# Patient Record
Sex: Female | Born: 1945 | Race: Black or African American | Hispanic: No | Smoking: Former smoker
Health system: Southern US, Community
[De-identification: ages and names within clinical notes are randomized; demographics above are authoritative.]

## PROBLEM LIST (undated history)

## (undated) DIAGNOSIS — I10 Essential (primary) hypertension: Secondary | ICD-10-CM

## (undated) DIAGNOSIS — I5030 Unspecified diastolic (congestive) heart failure: Secondary | ICD-10-CM

## (undated) DIAGNOSIS — M1990 Unspecified osteoarthritis, unspecified site: Secondary | ICD-10-CM

## (undated) DIAGNOSIS — M069 Rheumatoid arthritis, unspecified: Secondary | ICD-10-CM

## (undated) DIAGNOSIS — D649 Anemia, unspecified: Secondary | ICD-10-CM

## (undated) DIAGNOSIS — R918 Other nonspecific abnormal finding of lung field: Secondary | ICD-10-CM

## (undated) DIAGNOSIS — I472 Ventricular tachycardia: Principal | ICD-10-CM

## (undated) DIAGNOSIS — E876 Hypokalemia: Secondary | ICD-10-CM

## (undated) HISTORY — DX: Anemia, unspecified: D64.9

## (undated) HISTORY — DX: Hypokalemia: E87.6

## (undated) HISTORY — DX: Ventricular tachycardia: I47.2

## (undated) HISTORY — DX: Unspecified osteoarthritis, unspecified site: M19.90

## (undated) HISTORY — PX: ABDOMINAL HYSTERECTOMY: SHX81

## (undated) HISTORY — DX: Rheumatoid arthritis, unspecified: M06.9

## (undated) HISTORY — PX: OTHER SURGICAL HISTORY: SHX169

## (undated) HISTORY — DX: Other nonspecific abnormal finding of lung field: R91.8

## (undated) HISTORY — DX: Unspecified diastolic (congestive) heart failure: I50.30

---

## 2000-09-01 ENCOUNTER — Other Ambulatory Visit: Admission: RE | Admit: 2000-09-01 | Discharge: 2000-09-01 | Payer: Self-pay | Admitting: Obstetrics and Gynecology

## 2001-06-20 ENCOUNTER — Encounter: Payer: Self-pay | Admitting: Obstetrics and Gynecology

## 2001-06-20 ENCOUNTER — Ambulatory Visit (HOSPITAL_COMMUNITY): Admission: RE | Admit: 2001-06-20 | Discharge: 2001-06-20 | Payer: Self-pay | Admitting: Obstetrics and Gynecology

## 2002-06-21 ENCOUNTER — Ambulatory Visit (HOSPITAL_COMMUNITY): Admission: RE | Admit: 2002-06-21 | Discharge: 2002-06-21 | Payer: Self-pay | Admitting: Obstetrics and Gynecology

## 2002-06-21 ENCOUNTER — Encounter: Payer: Self-pay | Admitting: Obstetrics and Gynecology

## 2003-09-10 ENCOUNTER — Ambulatory Visit (HOSPITAL_COMMUNITY): Admission: RE | Admit: 2003-09-10 | Discharge: 2003-09-10 | Payer: Self-pay | Admitting: Obstetrics and Gynecology

## 2004-06-15 ENCOUNTER — Ambulatory Visit (HOSPITAL_COMMUNITY): Admission: RE | Admit: 2004-06-15 | Discharge: 2004-06-15 | Payer: Self-pay | Admitting: *Deleted

## 2004-10-01 ENCOUNTER — Ambulatory Visit (HOSPITAL_COMMUNITY): Admission: RE | Admit: 2004-10-01 | Discharge: 2004-10-01 | Payer: Self-pay | Admitting: Obstetrics and Gynecology

## 2004-10-11 ENCOUNTER — Encounter: Admission: RE | Admit: 2004-10-11 | Discharge: 2004-10-11 | Payer: Self-pay | Admitting: Obstetrics and Gynecology

## 2004-12-14 ENCOUNTER — Encounter: Admission: RE | Admit: 2004-12-14 | Discharge: 2004-12-14 | Payer: Self-pay | Admitting: Obstetrics and Gynecology

## 2005-06-16 ENCOUNTER — Encounter: Admission: RE | Admit: 2005-06-16 | Discharge: 2005-06-16 | Payer: Self-pay | Admitting: Obstetrics and Gynecology

## 2006-01-04 ENCOUNTER — Ambulatory Visit (HOSPITAL_COMMUNITY): Admission: RE | Admit: 2006-01-04 | Discharge: 2006-01-04 | Payer: Self-pay | Admitting: Obstetrics and Gynecology

## 2006-03-30 ENCOUNTER — Ambulatory Visit (HOSPITAL_COMMUNITY): Admission: RE | Admit: 2006-03-30 | Discharge: 2006-03-30 | Payer: Self-pay | Admitting: Family Medicine

## 2007-01-10 ENCOUNTER — Ambulatory Visit (HOSPITAL_COMMUNITY): Admission: RE | Admit: 2007-01-10 | Discharge: 2007-01-10 | Payer: Self-pay | Admitting: Obstetrics and Gynecology

## 2008-02-27 ENCOUNTER — Ambulatory Visit (HOSPITAL_COMMUNITY): Admission: RE | Admit: 2008-02-27 | Discharge: 2008-02-27 | Payer: Self-pay | Admitting: Obstetrics and Gynecology

## 2009-02-04 ENCOUNTER — Ambulatory Visit (HOSPITAL_COMMUNITY): Admission: RE | Admit: 2009-02-04 | Discharge: 2009-02-04 | Payer: Self-pay | Admitting: Family Medicine

## 2009-03-11 ENCOUNTER — Ambulatory Visit (HOSPITAL_COMMUNITY): Admission: RE | Admit: 2009-03-11 | Discharge: 2009-03-11 | Payer: Self-pay | Admitting: Obstetrics and Gynecology

## 2009-08-03 ENCOUNTER — Ambulatory Visit (HOSPITAL_COMMUNITY): Admission: RE | Admit: 2009-08-03 | Discharge: 2009-08-03 | Payer: Self-pay | Admitting: Family Medicine

## 2010-05-02 ENCOUNTER — Encounter: Payer: Self-pay | Admitting: Obstetrics and Gynecology

## 2010-06-29 LAB — CREATININE, SERUM
GFR calc Af Amer: >60 mL/min (ref >60)
GFR calc non Af Amer: >60 mL/min (ref >60)

## 2010-07-14 ENCOUNTER — Other Ambulatory Visit: Payer: Self-pay | Admitting: Obstetrics and Gynecology

## 2010-07-14 DIAGNOSIS — Z139 Encounter for screening, unspecified: Secondary | ICD-10-CM

## 2010-07-15 LAB — CREATININE, SERUM
Creatinine, Ser: 0.82 mg/dL (ref 0.4–1.2)
GFR calc Af Amer: >60 mL/min (ref >60)

## 2010-07-19 ENCOUNTER — Ambulatory Visit (HOSPITAL_COMMUNITY)
Admission: RE | Admit: 2010-07-19 | Discharge: 2010-07-19 | Disposition: A | Discharge status: Home / Self Care | Payer: Medicare Other | Source: Ambulatory Visit | Attending: Obstetrics and Gynecology | Admitting: Obstetrics and Gynecology

## 2010-07-19 DIAGNOSIS — Z139 Encounter for screening, unspecified: Secondary | ICD-10-CM

## 2010-07-19 DIAGNOSIS — Z1231 Encounter for screening mammogram for malignant neoplasm of breast: Secondary | ICD-10-CM | POA: Insufficient documentation

## 2010-09-28 ENCOUNTER — Other Ambulatory Visit (HOSPITAL_COMMUNITY): Payer: Self-pay | Admitting: Internal Medicine

## 2010-09-28 ENCOUNTER — Ambulatory Visit (HOSPITAL_COMMUNITY)
Admission: RE | Admit: 2010-09-28 | Discharge: 2010-09-28 | Disposition: A | Discharge status: Home / Self Care | Payer: Medicare Other | Source: Ambulatory Visit | Attending: Internal Medicine | Admitting: Internal Medicine

## 2010-09-28 DIAGNOSIS — M79606 Pain in leg, unspecified: Secondary | ICD-10-CM

## 2010-09-28 DIAGNOSIS — M79609 Pain in unspecified limb: Secondary | ICD-10-CM | POA: Insufficient documentation

## 2010-10-19 ENCOUNTER — Other Ambulatory Visit (HOSPITAL_COMMUNITY): Payer: Self-pay | Admitting: Orthopaedic Surgery

## 2010-10-19 DIAGNOSIS — M25562 Pain in left knee: Secondary | ICD-10-CM

## 2010-10-21 ENCOUNTER — Ambulatory Visit (HOSPITAL_COMMUNITY)
Admission: RE | Admit: 2010-10-21 | Discharge: 2010-10-21 | Disposition: A | Discharge status: Home / Self Care | Payer: Medicare Other | Source: Ambulatory Visit | Attending: Orthopaedic Surgery | Admitting: Orthopaedic Surgery

## 2010-10-21 DIAGNOSIS — M25562 Pain in left knee: Secondary | ICD-10-CM

## 2010-10-21 DIAGNOSIS — M7120 Synovial cyst of popliteal space [Baker], unspecified knee: Secondary | ICD-10-CM | POA: Insufficient documentation

## 2010-10-21 DIAGNOSIS — M25569 Pain in unspecified knee: Secondary | ICD-10-CM | POA: Insufficient documentation

## 2010-10-21 DIAGNOSIS — M659 Unspecified synovitis and tenosynovitis, unspecified site: Secondary | ICD-10-CM | POA: Insufficient documentation

## 2011-01-31 ENCOUNTER — Ambulatory Visit (HOSPITAL_COMMUNITY)
Admission: RE | Admit: 2011-01-31 | Discharge: 2011-01-31 | Disposition: A | Discharge status: Home / Self Care | Payer: Medicare Other | Source: Ambulatory Visit | Attending: Family Medicine | Admitting: Family Medicine

## 2011-01-31 ENCOUNTER — Other Ambulatory Visit (HOSPITAL_COMMUNITY): Payer: Self-pay | Admitting: Family Medicine

## 2011-01-31 DIAGNOSIS — R059 Cough, unspecified: Secondary | ICD-10-CM | POA: Insufficient documentation

## 2011-01-31 DIAGNOSIS — R05 Cough: Secondary | ICD-10-CM | POA: Insufficient documentation

## 2011-04-22 ENCOUNTER — Other Ambulatory Visit (HOSPITAL_COMMUNITY): Payer: Self-pay | Admitting: Family Medicine

## 2011-04-22 DIAGNOSIS — R0789 Other chest pain: Secondary | ICD-10-CM

## 2011-04-25 ENCOUNTER — Encounter (HOSPITAL_COMMUNITY): Payer: Self-pay

## 2011-04-25 ENCOUNTER — Ambulatory Visit (HOSPITAL_COMMUNITY)
Admission: RE | Admit: 2011-04-25 | Discharge: 2011-04-25 | Disposition: A | Discharge status: Home / Self Care | Payer: Medicare Other | Source: Ambulatory Visit | Attending: Family Medicine | Admitting: Family Medicine

## 2011-04-25 DIAGNOSIS — J984 Other disorders of lung: Secondary | ICD-10-CM | POA: Insufficient documentation

## 2011-04-25 DIAGNOSIS — R0789 Other chest pain: Secondary | ICD-10-CM

## 2011-04-25 DIAGNOSIS — J479 Bronchiectasis, uncomplicated: Secondary | ICD-10-CM | POA: Insufficient documentation

## 2011-04-25 DIAGNOSIS — R042 Hemoptysis: Secondary | ICD-10-CM | POA: Insufficient documentation

## 2011-04-25 HISTORY — DX: Essential (primary) hypertension: I10

## 2011-04-25 MED ORDER — IOHEXOL 300 MG/ML  SOLN
80.0000 mL | Freq: Once | INTRAMUSCULAR | Status: AC | PRN
  Administered 2011-04-25: 80 mL via INTRAVENOUS

## 2011-04-26 ENCOUNTER — Ambulatory Visit (HOSPITAL_COMMUNITY)
Admission: RE | Admit: 2011-04-26 | Discharge: 2011-04-26 | Disposition: A | Discharge status: Home / Self Care | Payer: Medicare Other | Source: Ambulatory Visit | Attending: Family Medicine | Admitting: Family Medicine

## 2011-04-26 DIAGNOSIS — L89899 Pressure ulcer of other site, unspecified stage: Secondary | ICD-10-CM | POA: Insufficient documentation

## 2011-04-26 DIAGNOSIS — L8992 Pressure ulcer of unspecified site, stage 2: Secondary | ICD-10-CM | POA: Insufficient documentation

## 2011-04-26 DIAGNOSIS — IMO0001 Reserved for inherently not codable concepts without codable children: Secondary | ICD-10-CM | POA: Insufficient documentation

## 2011-04-26 DIAGNOSIS — L89309 Pressure ulcer of unspecified buttock, unspecified stage: Secondary | ICD-10-CM | POA: Insufficient documentation

## 2011-04-26 NOTE — Progress Notes (Signed)
Physical Therapy - Wound Therapy  Evaluation  Patient Details  Name: Diana Baker MRN: [id number] Date of Birth: May 06, 1945  Today's Date: 04/26/2011 Time: 4098-1191 Time Calculation (min): 37 min  Visit#: 1  of 3   Re-eval: 05/10/11  Wound Therapy Pressure Ulcer 04/26/11 Stage II -  Partial thickness loss of dermis presenting as a shallow open ulcer with a red, pink wound bed without slough. (Active)  State of Healing Early/partial granulation 04/26/2011  2:37 PM  Site / Wound Assessment Dry;Granulation tissue 04/26/2011  2:37 PM  % Wound base Red or Granulating 75% 04/26/2011  2:37 PM  % Wound base Yellow 25% 04/26/2011  2:37 PM  % Wound base Black 0% 04/26/2011  2:37 PM  % Wound base Other (Comment) 0% 04/26/2011  2:37 PM  Peri-wound Assessment Intact 04/26/2011  2:37 PM  Wound Length (cm) 3 cm 04/26/2011  2:37 PM  Wound Width (cm) 5 cm 04/26/2011  2:37 PM  Wound Depth (cm) 0.1 cm 04/26/2011  2:37 PM  Margins Attached edges (approximated) 04/26/2011  2:37 PM  Drainage Amount Scant 04/26/2011  2:37 PM  Drainage Description Serous 04/26/2011  2:37 PM  Treatment Cleansed;Irrigation 04/26/2011  2:37 PM  Dressing Type Hydrogel;Tape dressing 04/26/2011  2:37 PM  Dressing Changed;Intact 04/26/2011  2:37 PM     Pressure Ulcer 04/26/11 Stage II -  Partial thickness loss of dermis presenting as a shallow open ulcer with a red, pink wound bed without slough. (Active)  State of Healing Early/partial granulation 04/26/2011  2:37 PM  Site / Wound Assessment Dry;Granulation tissue 04/26/2011  2:37 PM  % Wound base Red or Granulating 75% 04/26/2011  2:37 PM  % Wound base Yellow 25% 04/26/2011  2:37 PM  % Wound base Black 0% 04/26/2011  2:37 PM  % Wound base Other (Comment) 0% 04/26/2011  2:37 PM  Wound Length (cm) 3 cm 04/26/2011  2:37 PM  Wound Width (cm) 3 cm 04/26/2011  2:37 PM  Wound Depth (cm) 0.2 cm 04/26/2011  2:37 PM  Margins Attached edges (approximated) 04/26/2011  2:37 PM  Drainage Amount Scant  04/26/2011  2:37 PM  Drainage Description Serous 04/26/2011  2:37 PM  Treatment Cleansed;Irrigation 04/26/2011  2:37 PM  Dressing Type Hydrogel;Tape dressing 04/26/2011  2:37 PM     Pressure Ulcer 04/26/11 Stage II -  Partial thickness loss of dermis presenting as a shallow open ulcer with a red, pink wound bed without slough. (Active)  State of Healing Early/partial granulation 04/26/2011  2:37 PM  Site / Wound Assessment Dry 04/26/2011  2:37 PM  % Wound base Red or Granulating 75% 04/26/2011  2:37 PM  % Wound base Yellow 25% 04/26/2011  2:37 PM  % Wound base Black 0% 04/26/2011  2:37 PM  Peri-wound Assessment Intact 04/26/2011  2:37 PM  Wound Length (cm) 1.5 cm 04/26/2011  2:37 PM  Wound Width (cm) 2.75 cm 04/26/2011  2:37 PM  Wound Depth (cm) 0.2 cm 04/26/2011  2:37 PM  Margins Attached edges (approximated) 04/26/2011  2:37 PM  Drainage Amount Scant 04/26/2011  2:37 PM  Drainage Description Serous 04/26/2011  2:37 PM  Treatment Cleansed;Irrigation 04/26/2011  2:37 PM  Dressing Type Hydrogel;Tape dressing 04/26/2011  2:37 PM       Physical Therapy Assessment and Plan Wound Therapy - Assess/Plan/Recommendations Wound Therapy - Clinical Statement: Pt with multiple pressure sores.  Pt has been caring for ulcers at home but they are located in a difficult place for her to ensure proper care is  given.  Pt will benefit from skilled PT one time a week to ensue wounds are healing properly. Factors Delaying/Impairing Wound Healing: Immobility;Multiple medical problems Hydrotherapy Plan: Dressing change;Patient/family education;Debridement (debridement if needed.) Wound Therapy - Frequency:  (1x wk) Wound Therapy - Current Recommendations: PT PT Assessment and Plan Clinical Impression Statement: Pt with non-healing wounds who will benefit from skilled PT to ensure proper healing. Rehab Potential: Good PT Frequency: Min 1X/week PT Duration:  (3 wks) PT Treatment/Interventions: Other (comment) (wound  care.) PT Plan: Use Tegaderm to dress wounds next treatment.    Goals  wounds to decrease size by 50%; increase depth to no greater than .1 cm and be 100% granulation.  Problem List There is no problem list on file for this patient.   RUSSELL,CINDY 04/26/2011, 2:56 PM

## 2011-05-03 ENCOUNTER — Ambulatory Visit (HOSPITAL_COMMUNITY)
Admission: RE | Admit: 2011-05-03 | Discharge: 2011-05-03 | Disposition: A | Discharge status: Home / Self Care | Payer: Medicare Other | Source: Ambulatory Visit | Attending: Family Medicine | Admitting: Family Medicine

## 2011-05-03 NOTE — Progress Notes (Signed)
Physical Therapy - Wound Therapy  Treatment  Patient Details  Name: Diana Baker MRN: [id number] Date of Birth: 01/05/1946  Today's Date: 05/03/2011 Time: 7829-5621 Time Calculation (min): 25 min  Visit#: 2  of 3   Re-eval: 05/10/11 Charges: Selective debridement (= or < 20 cm)  Wound Therapy Pressure Ulcer 04/26/11 Stage II -  Partial thickness loss of dermis presenting as a shallow open ulcer with a red, pink wound bed without slough. (Active)  State of Healing Early/partial granulation 04/26/2011  2:37 PM  Site / Wound Assessment Dry;Granulation tissue 04/26/2011  2:37 PM  % Wound base Red or Granulating 100% 05/03/2011  1:00 PM  % Wound base Yellow 25% 04/26/2011  2:37 PM  % Wound base Black 0% 04/26/2011  2:37 PM  % Wound base Other (Comment) 0% 04/26/2011  2:37 PM  Peri-wound Assessment Intact 05/03/2011  1:00 PM  Wound Length (cm) 3 cm 04/26/2011  2:37 PM  Wound Width (cm) 5 cm 04/26/2011  2:37 PM  Wound Depth (cm) 0.1 cm 04/26/2011  2:37 PM  Margins Attached edges (approximated) 05/03/2011  1:00 PM  Drainage Amount Scant 05/03/2011  1:00 PM  Drainage Description Serous 05/03/2011  1:00 PM  Treatment Cleansed;Irrigation 05/03/2011  1:00 PM  Dressing Type Hydrogel;Tape dressing 05/03/2011  1:00 PM  Dressing Changed;Intact 05/03/2011  1:00 PM     Pressure Ulcer 04/26/11 Stage II -  Partial thickness loss of dermis presenting as a shallow open ulcer with a red, pink wound bed without slough. (Active)  State of Healing Early/partial granulation 04/26/2011  2:37 PM  Site / Wound Assessment Dry;Granulation tissue 04/26/2011  2:37 PM  % Wound base Red or Granulating 100% 05/03/2011  1:00 PM  % Wound base Yellow 25% 04/26/2011  2:37 PM  % Wound base Black 0% 04/26/2011  2:37 PM  % Wound base Other (Comment) 0% 04/26/2011  2:37 PM  Wound Length (cm) 3 cm 04/26/2011  2:37 PM  Wound Width (cm) 3 cm 04/26/2011  2:37 PM  Wound Depth (cm) 0.2 cm 04/26/2011  2:37 PM  Margins Attached edges (approximated)  05/03/2011  1:00 PM  Drainage Amount Scant 05/03/2011  1:00 PM  Drainage Description Serous 05/03/2011  1:00 PM  Treatment Cleansed;Irrigation 05/03/2011  1:00 PM  Dressing Type Hydrogel;Tape dressing 05/03/2011  1:00 PM     Pressure Ulcer 04/26/11 Stage II -  Partial thickness loss of dermis presenting as a shallow open ulcer with a red, pink wound bed without slough. (Active)  State of Healing Early/partial granulation 04/26/2011  2:37 PM  Site / Wound Assessment Dry 04/26/2011  2:37 PM  % Wound base Red or Granulating 100% 05/03/2011  1:00 PM  % Wound base Yellow 25% 04/26/2011  2:37 PM  % Wound base Black 0% 04/26/2011  2:37 PM  Peri-wound Assessment Intact 05/03/2011  1:00 PM  Wound Length (cm) 1.5 cm 04/26/2011  2:37 PM  Wound Width (cm) 2.75 cm 04/26/2011  2:37 PM  Wound Depth (cm) 0.2 cm 04/26/2011  2:37 PM  Margins Attached edges (approximated) 05/03/2011  1:00 PM  Drainage Amount Scant 05/03/2011  1:00 PM  Drainage Description Serous 05/03/2011  1:00 PM  Treatment Cleansed;Irrigation 05/03/2011  1:00 PM  Dressing Type Hydrogel;Tape dressing 05/03/2011  1:00 PM     Wound 05/03/11 Other (Comment) Thigh Upper;Medial Small pink/red wound located on inner thight distal to vaginal area (Active)  % Wound base Red or Granulating 100% 05/03/2011  1:00 PM  Peri-wound Assessment Intact 05/03/2011  1:00  PM  Wound Length (cm) 0.5 cm 05/03/2011  1:00 PM  Wound Width (cm) 2 cm 05/03/2011  1:00 PM  Margins Attached edges (approximated) 05/03/2011  1:00 PM  Drainage Amount Scant 05/03/2011  1:00 PM  Drainage Description Serous 05/03/2011  1:00 PM  Treatment Cleansed;Irrigation 05/03/2011  1:00 PM  Dressing Type Hydrogel;Tape dressing 05/03/2011  1:00 PM  Dressing Changed New 05/03/2011  1:00 PM  Dressing Status Clean;Dry;Intact 05/03/2011  1:00 PM    Physical Therapy Assessment and Plan Wound Therapy - Assess/Plan/Recommendations Hydrotherapy Plan: Dressing change;Patient/family education;Debridement PT Assessment  and Plan Clinical Impression Statement: Pt tolerates tx well. Wounds present with 100% granulation. Fourth wound noted on inner thigh distal to vaginal area. New LDA made (see doc flowsheets). Pt reports that the MD has given her Mypirocin to put on her wounds.  PT Plan: Continue per PT POC. Use Tegaderm to dress wounds next treatment.     Problem List There is no problem list on file for this patient.   Seth Bake [facility] 05/03/2011, 1:48 PM

## 2011-05-09 ENCOUNTER — Other Ambulatory Visit (HOSPITAL_COMMUNITY): Payer: Self-pay

## 2011-05-10 ENCOUNTER — Ambulatory Visit (HOSPITAL_COMMUNITY)
Admission: RE | Admit: 2011-05-10 | Discharge: 2011-05-10 | Disposition: A | Discharge status: Home / Self Care | Payer: Medicare Other | Source: Ambulatory Visit | Attending: Pulmonary Disease | Admitting: Pulmonary Disease

## 2011-05-10 ENCOUNTER — Ambulatory Visit (HOSPITAL_COMMUNITY)
Admission: RE | Admit: 2011-05-10 | Discharge: 2011-05-10 | Disposition: A | Discharge status: Home / Self Care | Payer: Medicare Other | Source: Ambulatory Visit | Attending: Family Medicine | Admitting: Family Medicine

## 2011-05-10 ENCOUNTER — Telehealth (HOSPITAL_COMMUNITY): Payer: Self-pay

## 2011-05-10 DIAGNOSIS — R0602 Shortness of breath: Secondary | ICD-10-CM | POA: Insufficient documentation

## 2011-05-10 LAB — BLOOD GAS, ARTERIAL
Acid-Base Excess: 2 mmol/L (ref 0.0–2.0)
O2 Saturation: 93 %
TCO2: 23.7 mmol/L (ref 0–100)
pO2, Arterial: 65.6 mmHg — ABNORMAL LOW (ref 80.0–100.0)

## 2011-05-10 MED ORDER — ALBUTEROL SULFATE (5 MG/ML) 0.5% IN NEBU
2.5000 mg | INHALATION_SOLUTION | Freq: Once | RESPIRATORY_TRACT | Status: AC
  Administered 2011-05-10: 2.5 mg via RESPIRATORY_TRACT

## 2011-05-10 NOTE — Progress Notes (Signed)
Physical Therapy - Wound Therapy  Treatment  Patient Details  Name: Diana Baker MRN: [id number] Date of Birth: 09-Dec-1945  Today's Date: 05/10/2011 Time: 4098-1191 Time Calculation (min): 30 min  Visit#: 3  of 3   Re-eval: 05/10/11  Wound Therapy Pressure Ulcer 04/26/11 Stage II -  Partial thickness loss of dermis presenting as a shallow open ulcer with a red, pink wound bed without slough. (Active)  State of Healing Early/partial granulation 05/10/2011 11:38 AM  Site / Wound Assessment Dry;Granulation tissue 05/10/2011 11:38 AM  % Wound base Red or Granulating 100% 05/10/2011 11:38 AM  % Wound base Yellow 0% 05/10/2011 11:38 AM  % Wound base Black 0% 05/10/2011 11:38 AM  % Wound base Other (Comment) 0% 05/10/2011 11:38 AM  Peri-wound Assessment Intact 05/10/2011 11:38 AM  Wound Length (cm) 3 cm 04/26/2011  2:37 PM  Wound Width (cm) 5 cm 04/26/2011  2:37 PM  Wound Depth (cm) 0.1 cm 04/26/2011  2:37 PM  Margins Attached edges (approximated) 05/03/2011  1:00 PM  Drainage Amount Scant 05/03/2011  1:00 PM  Drainage Description Serous 05/03/2011  1:00 PM  Treatment Cleansed;Irrigation 05/10/2011 11:38 AM  Dressing Type Hydrogel;Tape dressing 05/10/2011 11:38 AM  Dressing Changed;Intact 05/10/2011 11:38 AM     Pressure Ulcer 04/26/11 Stage II -  Partial thickness loss of dermis presenting as a shallow open ulcer with a red, pink wound bed without slough. (Active)  State of Healing Early/partial granulation 05/10/2011 11:38 AM  Site / Wound Assessment Clean;Dry;Granulation tissue 05/10/2011 11:38 AM  % Wound base Red or Granulating 100% 05/10/2011 11:38 AM  % Wound base Yellow 0% 05/10/2011 11:38 AM  % Wound base Black 0% 05/10/2011 11:38 AM  % Wound base Other (Comment) 0% 05/10/2011 11:38 AM  Wound Length (cm) 3 cm 04/26/2011  2:37 PM  Wound Width (cm) 3 cm 04/26/2011  2:37 PM  Wound Depth (cm) 0.2 cm 04/26/2011  2:37 PM  Margins Attached edges (approximated) 05/10/2011 11:38 AM  Drainage Amount Scant  05/10/2011 11:38 AM  Drainage Description Serous 05/10/2011 11:38 AM  Treatment Irrigation;Cleansed 05/10/2011 11:38 AM  Dressing Type Hydrogel;Tape dressing 05/10/2011 11:38 AM  Dressing Clean;Dry;Intact 05/10/2011 11:38 AM     Pressure Ulcer 04/26/11 Stage II -  Partial thickness loss of dermis presenting as a shallow open ulcer with a red, pink wound bed without slough. (Active)  State of Healing Early/partial granulation 05/10/2011 11:38 AM  Site / Wound Assessment Dry 05/10/2011 11:38 AM  % Wound base Red or Granulating 100% 05/10/2011 11:38 AM  % Wound base Yellow 0% 05/10/2011 11:38 AM  % Wound base Black 0% 05/10/2011 11:38 AM  % Wound base Other (Comment) 0% 05/10/2011 11:38 AM  Peri-wound Assessment Intact 05/10/2011 11:38 AM  Wound Length (cm) 1.5 cm 04/26/2011  2:37 PM  Wound Width (cm) 2.75 cm 04/26/2011  2:37 PM  Wound Depth (cm) 0.2 cm 04/26/2011  2:37 PM  Margins Attached edges (approximated) 05/10/2011 11:38 AM  Drainage Amount Scant 05/10/2011 11:38 AM  Drainage Description Serous 05/10/2011 11:38 AM  Treatment Cleansed;Irrigation 05/10/2011 11:38 AM  Dressing Type Hydrogel;Tape dressing 05/10/2011 11:38 AM  Dressing Changed;Clean;Dry;Intact 05/10/2011 11:38 AM     Wound 05/03/11 Other (Comment) Thigh Upper;Medial Small pink/red wound located on inner thight distal to vaginal area (Active)  % Wound base Red or Granulating 100% 05/03/2011  1:00 PM  Peri-wound Assessment Intact 05/03/2011  1:00 PM  Wound Length (cm) 0.5 cm 05/03/2011  1:00 PM  Wound Width (cm) 2 cm 05/03/2011  1:00 PM  Margins Attached edges (approximated) 05/03/2011  1:00 PM  Drainage Amount Scant 05/03/2011  1:00 PM  Drainage Description Serous 05/03/2011  1:00 PM  Treatment Cleansed;Irrigation 05/03/2011  1:00 PM  Dressing Type Hydrogel;Tape dressing 05/03/2011  1:00 PM  Dressing Changed New 05/03/2011  1:00 PM  Dressing Status Clean;Dry;Intact 05/03/2011  1:00 PM       Physical Therapy Assessment and Plan Wound Therapy -  Assess/Plan/Recommendations Wound Therapy - Clinical Statement: Pt with multiple pressure sore with scant drainage and no slough, 100% granulation on all pressure sores with no debridement required just cleaned and irrigated.  Noted red spot on sacrumm, no debridement/dressings complete to new spot today, continue looking next visit to check progress. Hydrotherapy Plan: Dressing change;Other (comment) (Re-assess next session)      Goals    Problem List There is no problem list on file for this patient.   Juel Burrow 05/10/2011, 12:00 PM

## 2011-05-11 NOTE — Procedures (Signed)
[name], NORGARD                   ACCOUNT NO.:  [id number]  MEDICAL RECORD NO.:  [id number]  LOCATION:                                 FACILITY:  PHYSICIAN:  Shatiqua Heroux L. Juanetta Gosling, M.D.DATE OF BIRTH:  1945-05-02  DATE OF PROCEDURE: DATE OF DISCHARGE:                           PULMONARY FUNCTION TEST   Reason for pulmonary function testing is shortness of breath. 1. Spirometry shows a mild ventilatory defect with evidence of airflow     obstruction. 2. Lung volumes are reduced with total lung capacity being 66% of     predicted. 3. DLCO is reduced but does correct somewhat when volume is     considered. 4. Arterial blood gas is normal but with some relative hypoxia. 5. There is no significant bronchodilator improvement.     [name] L. Juanetta Gosling, M.D.     ELH/MEDQ  D:  05/10/2011  T:  05/10/2011  Job:  [id number]  cc:   Terie Purser, [facility]

## 2011-06-10 DIAGNOSIS — I472 Ventricular tachycardia: Secondary | ICD-10-CM

## 2011-06-10 DIAGNOSIS — E876 Hypokalemia: Secondary | ICD-10-CM

## 2011-06-10 DIAGNOSIS — I4729 Other ventricular tachycardia: Secondary | ICD-10-CM

## 2011-06-10 HISTORY — DX: Ventricular tachycardia: I47.2

## 2011-06-10 HISTORY — DX: Other ventricular tachycardia: I47.29

## 2011-06-10 HISTORY — DX: Hypokalemia: E87.6

## 2011-07-06 ENCOUNTER — Encounter (HOSPITAL_COMMUNITY): Payer: Self-pay | Admitting: *Deleted

## 2011-07-06 ENCOUNTER — Emergency Department (HOSPITAL_COMMUNITY): Payer: Medicare Other

## 2011-07-06 ENCOUNTER — Other Ambulatory Visit: Payer: Self-pay

## 2011-07-06 ENCOUNTER — Inpatient Hospital Stay (HOSPITAL_COMMUNITY)
Admission: EM | Admit: 2011-07-06 | Discharge: 2011-07-13 | DRG: 308 | Disposition: A | Discharge status: Home / Self Care | Payer: Medicare Other | Attending: Cardiology | Admitting: Cardiology

## 2011-07-06 DIAGNOSIS — I509 Heart failure, unspecified: Secondary | ICD-10-CM | POA: Diagnosis present

## 2011-07-06 DIAGNOSIS — D649 Anemia, unspecified: Secondary | ICD-10-CM | POA: Diagnosis present

## 2011-07-06 DIAGNOSIS — R9431 Abnormal electrocardiogram [ECG] [EKG]: Secondary | ICD-10-CM

## 2011-07-06 DIAGNOSIS — D75839 Thrombocytosis, unspecified: Secondary | ICD-10-CM | POA: Diagnosis present

## 2011-07-06 DIAGNOSIS — D473 Essential (hemorrhagic) thrombocythemia: Secondary | ICD-10-CM | POA: Diagnosis present

## 2011-07-06 DIAGNOSIS — J45909 Unspecified asthma, uncomplicated: Secondary | ICD-10-CM | POA: Diagnosis present

## 2011-07-06 DIAGNOSIS — J471 Bronchiectasis with (acute) exacerbation: Secondary | ICD-10-CM | POA: Diagnosis present

## 2011-07-06 DIAGNOSIS — I4729 Other ventricular tachycardia: Principal | ICD-10-CM | POA: Diagnosis present

## 2011-07-06 DIAGNOSIS — I472 Ventricular tachycardia, unspecified: Principal | ICD-10-CM | POA: Diagnosis present

## 2011-07-06 DIAGNOSIS — I499 Cardiac arrhythmia, unspecified: Secondary | ICD-10-CM

## 2011-07-06 DIAGNOSIS — M129 Arthropathy, unspecified: Secondary | ICD-10-CM | POA: Diagnosis present

## 2011-07-06 DIAGNOSIS — R911 Solitary pulmonary nodule: Secondary | ICD-10-CM | POA: Diagnosis present

## 2011-07-06 DIAGNOSIS — I319 Disease of pericardium, unspecified: Secondary | ICD-10-CM | POA: Diagnosis present

## 2011-07-06 DIAGNOSIS — E876 Hypokalemia: Secondary | ICD-10-CM

## 2011-07-06 DIAGNOSIS — Z79899 Other long term (current) drug therapy: Secondary | ICD-10-CM

## 2011-07-06 DIAGNOSIS — R55 Syncope and collapse: Secondary | ICD-10-CM

## 2011-07-06 DIAGNOSIS — I059 Rheumatic mitral valve disease, unspecified: Secondary | ICD-10-CM | POA: Diagnosis present

## 2011-07-06 DIAGNOSIS — D638 Anemia in other chronic diseases classified elsewhere: Secondary | ICD-10-CM | POA: Diagnosis present

## 2011-07-06 DIAGNOSIS — I4891 Unspecified atrial fibrillation: Secondary | ICD-10-CM

## 2011-07-06 DIAGNOSIS — I5033 Acute on chronic diastolic (congestive) heart failure: Secondary | ICD-10-CM | POA: Diagnosis present

## 2011-07-06 DIAGNOSIS — Z7982 Long term (current) use of aspirin: Secondary | ICD-10-CM

## 2011-07-06 DIAGNOSIS — I3139 Other pericardial effusion (noninflammatory): Secondary | ICD-10-CM

## 2011-07-06 DIAGNOSIS — R042 Hemoptysis: Secondary | ICD-10-CM

## 2011-07-06 DIAGNOSIS — E8809 Other disorders of plasma-protein metabolism, not elsewhere classified: Secondary | ICD-10-CM | POA: Diagnosis present

## 2011-07-06 DIAGNOSIS — J811 Chronic pulmonary edema: Secondary | ICD-10-CM | POA: Diagnosis present

## 2011-07-06 DIAGNOSIS — I313 Pericardial effusion (noninflammatory): Secondary | ICD-10-CM

## 2011-07-06 DIAGNOSIS — M069 Rheumatoid arthritis, unspecified: Secondary | ICD-10-CM

## 2011-07-06 LAB — CBC
HCT: 25.5 % — ABNORMAL LOW (ref 36.0–46.0)
MCH: 25.5 pg — ABNORMAL LOW (ref 26.0–34.0)
MCV: 80.2 fL (ref 78.0–100.0)
Platelets: 776 10*3/uL — ABNORMAL HIGH (ref 150–400)
RBC: 3.18 MIL/uL — ABNORMAL LOW (ref 3.87–5.11)

## 2011-07-06 LAB — DIFFERENTIAL
Eosinophils Absolute: 0.2 10*3/uL (ref 0.0–0.7)
Eosinophils Relative: 1 % (ref 0–5)
Lymphocytes Relative: 9 % — ABNORMAL LOW (ref 12–46)
Lymphs Abs: 1.3 10*3/uL (ref 0.7–4.0)
Monocytes Absolute: 0.8 10*3/uL (ref 0.1–1.0)

## 2011-07-06 LAB — COMPREHENSIVE METABOLIC PANEL
ALT: 7 U/L (ref 0–35)
BUN: 4 mg/dL — ABNORMAL LOW (ref 6–23)
CO2: 31 mEq/L (ref 19–32)
Calcium: 8.9 mg/dL (ref 8.4–10.5)
Creatinine, Ser: 0.68 mg/dL (ref 0.50–1.10)
GFR calc Af Amer: >90 mL/min (ref >90)
GFR calc non Af Amer: 89 mL/min — ABNORMAL LOW (ref >90)
Glucose, Bld: 100 mg/dL — ABNORMAL HIGH (ref 70–99)
Sodium: 136 mEq/L (ref 135–145)
Total Protein: 8.2 g/dL (ref 6.0–8.3)

## 2011-07-06 LAB — POCT I-STAT TROPONIN I: Troponin i, poc: 0.02 ng/mL (ref 0.00–0.08)

## 2011-07-06 LAB — MAGNESIUM: Magnesium: 1.8 mg/dL (ref 1.5–2.5)

## 2011-07-06 MED ORDER — MAGNESIUM SULFATE 40 MG/ML IJ SOLN
2.0000 g | Freq: Once | INTRAMUSCULAR | Status: AC
  Administered 2011-07-06: 2 g via INTRAVENOUS
  Filled 2011-07-06: qty 50

## 2011-07-06 MED ORDER — CLONIDINE HCL 0.1 MG PO TABS
0.1000 mg | ORAL_TABLET | Freq: Once | ORAL | Status: AC
  Administered 2011-07-06: 0.1 mg via ORAL
  Filled 2011-07-06: qty 1

## 2011-07-06 MED ORDER — AMIODARONE HCL IN DEXTROSE 360-4.14 MG/200ML-% IV SOLN
30.0000 mg/h | INTRAVENOUS | Status: DC
  Administered 2011-07-06: 30 mg/h via INTRAVENOUS
  Filled 2011-07-06: qty 200

## 2011-07-06 MED ORDER — POTASSIUM CHLORIDE CRYS ER 20 MEQ PO TBCR
40.0000 meq | EXTENDED_RELEASE_TABLET | Freq: Once | ORAL | Status: AC
  Administered 2011-07-06: 40 meq via ORAL
  Filled 2011-07-06: qty 2

## 2011-07-06 MED ORDER — AMIODARONE HCL 150 MG/3ML IV SOLN
300.0000 mg | Freq: Once | INTRAVENOUS | Status: AC
  Administered 2011-07-06: 300 mg via INTRAVENOUS
  Filled 2011-07-06: qty 6

## 2011-07-06 MED ORDER — POTASSIUM CHLORIDE 10 MEQ/100ML IV SOLN
10.0000 meq | INTRAVENOUS | Status: DC
  Administered 2011-07-06: 10 meq via INTRAVENOUS
  Filled 2011-07-06: qty 100

## 2011-07-06 NOTE — ED Notes (Signed)
Patient having long run of wide complex tachycardia. Rate of 200 bpm. Patient states she is feeling very dizzy but remains A&O x 4. MD Lynelle Doctor at bedside with patient. Denies any chest pain.

## 2011-07-06 NOTE — ED Notes (Addendum)
Patient having 5 second run of Vtach. Is alert. States she is feeling very dizzy. A&Ox4. Instructed for deep breathing in through nose and out through mouth. Strong bilateral radial pulses.

## 2011-07-06 NOTE — ED Notes (Signed)
States she has been having dizzy spells on set yesterday

## 2011-07-06 NOTE — ED Notes (Addendum)
MD at bedside. Resting sitting up to side of bed. No distress. Equal chest rise and fall, regular, unlabored. Call bell within reach. Family with patient. Will continue to monitor.

## 2011-07-06 NOTE — ED Notes (Signed)
Normal saline started at [facility] in right hand with no signs of infiltration.

## 2011-07-06 NOTE — ED Notes (Signed)
Remains in normal sinus at 93 bpm. Denies any chest pain or shortness of breath. No additional episodes of wide complex tachycardia. Denies any needs at this time. Family at bedside. Call bell within reach.

## 2011-07-06 NOTE — ED Notes (Signed)
Report given to Danella Sensing, [facility] fundraiser at [place]. ETA 20 minutes.

## 2011-07-06 NOTE — ED Notes (Signed)
Normal saline started in right AC at [facility]. Patient denies chest pain. In normal sinus rhythm at 94 bpm. Denies dizziness at this time. BP 179/79. 99% on 2L O2 [place].

## 2011-07-06 NOTE — ED Provider Notes (Signed)
History   This chart was scribed for Ward Givens, MD by Sofie Rower. The patient was seen in room APA08/APA08 and the patient's care was started at 8:34PM.    CSN: [id number]  Arrival date & time 07/06/11  1854   First MD Initiated Contact with Patient 07/06/11 1929      Chief Complaint  Patient presents with  . Dizziness    (Consider location/radiation/quality/duration/timing/severity/associated sxs/prior treatment) HPI  Diana Baker is a 66 y.o. female who presents to the Emergency Department complaining of intermittant, episodes of  dizziness onset yesterday with associated symptoms of loss of consciousness, shortness of breath.   Pt "head starts spinning and she closes her eyes". Pt had about 6 episodes yesterday and had one episode of LOC briefly yesterday while alone. Pt states "she was sitting on the couch today, getting money out of her pocketbook, when her pocketbook just hit the ground." Pt son states "she passed out for about 3 seconds". Pt states she "feels like my lungs have swollen up into my chest". Pt has had swelling in her legs and SOB and DOE which has been chronic. Pt has never had these symptoms before. She has been sitting when they happen. She states she feels spinning, feels hot then almost passes out. No nausea, vomiting, change in vision.     She has recently had an  infected lung for which she visits with Dr. Juanetta Gosling. Pt states the infected lung was "like bronchitis, where which she had been coughing up blood".   Pt denies sweats, headache, chest pain, any warning signs prior to passing out other than feeling a spinning sensation, no  vision loss, pain upon waking up from LOC   PCP Dr. Phillips Odor at [facility].  Dr Juanetta Gosling for pulmonary  Past Medical History  Diagnosis Date  . Hypertension   Rheumatoid arthritis   FH  FOP had MI  History  Substance Use Topics  . Smoking status: Quit 20 years ago  . Smokeless tobacco: Not on file  . Alcohol Use: no    Lives with son  OB History    Grav Para Term Preterm Abortions TAB SAB Ect Mult Living                  Review of Systems  All other systems reviewed and are negative.    10 Systems reviewed and are negative for acute change except as noted in the HPI.   Allergies  Review of patient's allergies indicates no known allergies.  Home Medications   Current Outpatient Rx  Name Route Sig Dispense Refill  . NAPROXEN SODIUM 220 MG PO TABS Oral Take 440 mg by mouth as needed. FOR PAIN    . OLMESARTAN MEDOXOMIL-HCTZ 40-12.5 MG PO TABS Oral Take 1 tablet by mouth daily.      BP 197/74  Pulse 56  Temp(Src) 98.7 F (37.1 C) (Oral)  Resp 20  Ht 5' 1.5" (1.562 m)  Wt 168 lb (76.204 kg)  BMI 31.23 kg/m2  SpO2 100%  Vital signs normal except hypertension, bradycardia   Physical Exam  Nursing note and vitals reviewed. Constitutional: She is oriented to person, place, and time. She appears well-developed and well-nourished.  HENT:  Head: Normocephalic and atraumatic.  Nose: Nose normal.  Eyes: Conjunctivae and EOM are normal. No scleral icterus.       Sclera pale.   Neck: Neck supple. No thyromegaly present.  Cardiovascular: Normal rate.  An irregular rhythm present. Exam reveals  no gallop and no friction rub.   No murmur heard. Pulmonary/Chest: Breath sounds normal. No stridor. She has no wheezes. She has no rales. She exhibits no tenderness.  Abdominal: Bowel sounds are normal. She exhibits no distension. There is no tenderness. There is no rebound.  Musculoskeletal: Normal range of motion. She exhibits edema (Pitting edema of lower legs. ).       Pt  Has swelling of her PIP's of her fingers c/w RA.  Lymphadenopathy:    She has no cervical adenopathy.  Neurological: She is alert and oriented to person, place, and time. Coordination normal.  Skin: Skin is warm and dry. No rash noted. No erythema.  Psychiatric: She has a normal mood and affect. Her behavior is normal.     ED Course  Procedures (including critical care time)  I heard an irregular heartbeat on my physical exam. At that point EKG was ordered and patient was placed on a monitor. Her initial EKG appeared to be sinus tachycardia however shortly after she was placed on the monitor she was noted to have some bursts of wide complex tachycardia that appeared regular that was consistent with V. tach or torsades and then she looked like she was in a rhythm that was most consistent with atrial fibrillation with narrow complexes. Heart rate at that point was 194. She was given magnesium sulfate 2 g IV and an amiodarone bolus of 300 mg and then started on amiodarone drip. Shortly afterwards she converted to a normal sinus rhythm of her heart rate of 80. Her labs returned and she was noted to have moderate hypokalemia of 2.1. She was started on IV potassium and oral potassium.  Pt given clonidine for her persistant HTN while on amiodarone  22:24 Dr Onalee Hua, ask cardiology if she should go to [facility] or if not she will admit here.  2236 Dr Charm Barges, cardiology accepts in transfer to [facility] to stepdown  DIAGNOSTIC STUDIES: Oxygen Saturation is 100% on room air, normal by my interpretation.    COORDINATION OF CARE:  Results for orders placed during the hospital encounter of 07/06/11  PRO B NATRIURETIC PEPTIDE      Component Value Range   Pro B Natriuretic peptide (BNP) 3172.0 (*) 0 - 125 (pg/mL)  CBC      Component Value Range   WBC 13.6 (*) 4.0 - 10.5 (K/uL)   RBC 3.18 (*) 3.87 - 5.11 (MIL/uL)   Hemoglobin 8.1 (*) 12.0 - 15.0 (g/dL)   HCT 16.1 (*) 09.6 - 46.0 (%)   MCV 80.2  78.0 - 100.0 (fL)   MCH 25.5 (*) 26.0 - 34.0 (pg)   MCHC 31.8  30.0 - 36.0 (g/dL)   RDW 04.5 (*) 40.9 - 15.5 (%)   Platelets 776 (*) 150 - 400 (K/uL)  DIFFERENTIAL      Component Value Range   Neutrophils Relative 84 (*) 43 - 77 (%)   Neutro Abs 11.4 (*) 1.7 - 7.7 (K/uL)   Lymphocytes Relative 9 (*) 12 - 46 (%)   Lymphs Abs 1.3  0.7 -  4.0 (K/uL)   Monocytes Relative 6  3 - 12 (%)   Monocytes Absolute 0.8  0.1 - 1.0 (K/uL)   Eosinophils Relative 1  0 - 5 (%)   Eosinophils Absolute 0.2  0.0 - 0.7 (K/uL)   Basophils Relative 0  0 - 1 (%)   Basophils Absolute 0.0  0.0 - 0.1 (K/uL)  COMPREHENSIVE METABOLIC PANEL      Component Value  Range   Sodium 136  135 - 145 (mEq/L)   Potassium 2.1 (*) 3.5 - 5.1 (mEq/L)   Chloride 90 (*) 96 - 112 (mEq/L)   CO2 31  19 - 32 (mEq/L)   Glucose, Bld 100 (*) 70 - 99 (mg/dL)   BUN 4 (*) 6 - 23 (mg/dL)   Creatinine, Ser 1.61  0.50 - 1.10 (mg/dL)   Calcium 8.9  8.4 - 09.6 (mg/dL)   Total Protein 8.2  6.0 - 8.3 (g/dL)   Albumin 3.0 (*) 3.5 - 5.2 (g/dL)   AST 15  0 - 37 (U/L)   ALT 7  0 - 35 (U/L)   Alkaline Phosphatase 110  39 - 117 (U/L)   Total Bilirubin 0.5  0.3 - 1.2 (mg/dL)   GFR calc non Af Amer 89 (*) >90 (mL/min)   GFR calc Af Amer >90  >90 (mL/min)  MAGNESIUM      Component Value Range   Magnesium 1.8  1.5 - 2.5 (mg/dL)  POCT I-STAT TROPONIN I      Component Value Range   Troponin i, poc 0.02  0.00 - 0.08 (ng/mL)   Comment 3            Laboratory interpretation all normal except severe hypokalemia, elevated BNP, anemia   Dg Chest Port 1 View  07/06/2011  *RADIOLOGY REPORT*  Clinical Data: Short of breath.  Hemoptysis.  PORTABLE CHEST - 1 VIEW  Comparison: 01/31/2011  Findings: Cardiac enlargement with vascular congestion.  Bilateral pleural effusions have developed since the prior study.  Perihilar airspace disease bilaterally suggest fluid overload.  IMPRESSION: Findings are compatible with congestive heart failure with mild edema and small bilateral pleural effusions.  Original Report Authenticated By: Camelia Phenes, M.D.    EKG #1  Date: 07/06/2011  Rate: 107   Rhythm: sinus tachycardia  QRS Axis: normal  Intervals: QT prolonged  ST/T Wave abnormalities: normal  Conduction Disutrbances:none  Narrative Interpretation:   Old EKG Reviewed: changes noted from  06/15/2004 HR 74   EKG#2  Date: 07/06/2011  Rate: 194  Rhythm: atrial fibrillation  QRS Axis: normal  Intervals: normal  ST/T Wave abnormalities: nonspecific ST/T changes  Conduction Disutrbances:none  Narrative Interpretation:   Old EKG Reviewed: changes noted from 11 minutes earlier       1. Syncope   2. Congestive heart failure   3. Atrial fibrillation with RVR   4. Ventricular arrhythmia   5. Hypokalemia   6. Anemia     Plan transfer to [facility] to Cardiology  Devoria Albe, MD, FACEP  CRITICAL CARE Performed by: Devoria Albe L   Total critical care time: 45 min  Critical care time was exclusive of separately billable procedures and treating other patients.  Critical care was necessary to treat or prevent imminent or life-threatening deterioration.  Critical care was time spent personally by me on the following activities: development of treatment plan with patient and/or surrogate as well as nursing, discussions with consultants, evaluation of patient's response to treatment, examination of patient, obtaining history from patient or surrogate, ordering and performing treatments and interventions, ordering and review of laboratory studies, ordering and review of radiographic studies, pulse oximetry and re-evaluation of patient's condition.     MDM    I personally performed the services described in this documentation, which was scribed in my presence. The recorded information has been reviewed and considered. Devoria Albe, MD, FACEP   Ward Givens, MD 07/07/11 [phone]

## 2011-07-06 NOTE — ED Notes (Signed)
Magnesium infusion complete. Patient resting comfortably. No distress. Denies pain. Normal sinus at 85 bpm. BP 171/75. Breathing 20x\minute, regular, equal, unlabored. Family with patient. Call bell at bedside. Bed in low position and locked with side rails up.

## 2011-07-06 NOTE — ED Notes (Signed)
Into room to attempt iv access and draw blood. Patient resting sitting up in bed. No distress. Equal chest rise and fall, regular, unlabored. A&O x 4. Call bell within reach. Family with patient.

## 2011-07-06 NOTE — ED Notes (Signed)
Patient states she has been having dizzy spells since last night. States she sat down and fell asleep last night, heard her son "in a distance" trying to wake her up but she couldn't wake up. She eventually woke up. States she has been feeling dizzy on and off since then. Is A&O x 4 at this time. States walking around she is fine, but when she sits down the dizziness starts. Denies any weakness to a particular side. No facial drooping. States she has been urinating more frequently (about 8-9 times a night) for the past few days. Denies any four smell, cloudiness, or burning on urination. States she is being treated for a lung infection x 1 month. Has been coughing up blood tinged sputum since yesterday. Clear lung sounds in all fields with no signs of distress. Awaiting to be seen. Call bell within reach. Family with patient. Will continue to monitor.

## 2011-07-06 NOTE — ED Notes (Signed)
Medicated as ordered per MD. Denies any needs tolerated well. Call bell within reach. Denies pain. Equal chest rise and fall, regular, unlabored. Will continue to monitor.

## 2011-07-07 ENCOUNTER — Other Ambulatory Visit: Payer: Self-pay

## 2011-07-07 ENCOUNTER — Encounter (HOSPITAL_COMMUNITY): Payer: Self-pay | Admitting: Internal Medicine

## 2011-07-07 DIAGNOSIS — I472 Ventricular tachycardia: Secondary | ICD-10-CM | POA: Diagnosis present

## 2011-07-07 DIAGNOSIS — I4949 Other premature depolarization: Secondary | ICD-10-CM

## 2011-07-07 DIAGNOSIS — D473 Essential (hemorrhagic) thrombocythemia: Secondary | ICD-10-CM | POA: Diagnosis present

## 2011-07-07 DIAGNOSIS — I509 Heart failure, unspecified: Secondary | ICD-10-CM | POA: Diagnosis present

## 2011-07-07 DIAGNOSIS — R911 Solitary pulmonary nodule: Secondary | ICD-10-CM | POA: Diagnosis present

## 2011-07-07 DIAGNOSIS — I499 Cardiac arrhythmia, unspecified: Secondary | ICD-10-CM

## 2011-07-07 DIAGNOSIS — D649 Anemia, unspecified: Secondary | ICD-10-CM | POA: Diagnosis present

## 2011-07-07 DIAGNOSIS — I059 Rheumatic mitral valve disease, unspecified: Secondary | ICD-10-CM

## 2011-07-07 LAB — BASIC METABOLIC PANEL
BUN: 4 mg/dL — ABNORMAL LOW (ref 6–23)
CO2: 29 mEq/L (ref 19–32)
CO2: 31 mEq/L (ref 19–32)
Calcium: 8.6 mg/dL (ref 8.4–10.5)
Chloride: 94 mEq/L — ABNORMAL LOW (ref 96–112)
Creatinine, Ser: 0.6 mg/dL (ref 0.50–1.10)
Creatinine, Ser: 0.61 mg/dL (ref 0.50–1.10)
GFR calc Af Amer: >90 mL/min (ref >90)
GFR calc non Af Amer: 89 mL/min — ABNORMAL LOW (ref >90)
Glucose, Bld: 107 mg/dL — ABNORMAL HIGH (ref 70–99)
Glucose, Bld: 123 mg/dL — ABNORMAL HIGH (ref 70–99)
Potassium: 4.1 mEq/L (ref 3.5–5.1)
Sodium: 138 mEq/L (ref 135–145)
Sodium: 136 mEq/L (ref 135–145)

## 2011-07-07 LAB — FERRITIN: Ferritin: 275 ng/mL (ref 10–291)

## 2011-07-07 LAB — CBC
Platelets: 716 10*3/uL — ABNORMAL HIGH (ref 150–400)
RDW: 17.5 % — ABNORMAL HIGH (ref 11.5–15.5)
WBC: 12.4 10*3/uL — ABNORMAL HIGH (ref 4.0–10.5)

## 2011-07-07 LAB — VITAMIN B12: Vitamin B-12: 719 pg/mL (ref 211–911)

## 2011-07-07 LAB — IRON AND TIBC: Iron: 37 ug/dL — ABNORMAL LOW (ref 42–135)

## 2011-07-07 LAB — MRSA PCR SCREENING: MRSA by PCR: NEGATIVE

## 2011-07-07 LAB — RETICULOCYTES
Retic Count, Absolute: 56.5 10*3/uL (ref 19.0–186.0)
Retic Ct Pct: 1.8 % (ref 0.4–3.1)

## 2011-07-07 LAB — TECHNOLOGIST SMEAR REVIEW

## 2011-07-07 LAB — CARDIAC PANEL(CRET KIN+CKTOT+MB+TROPI)
Relative Index: 0.5 (ref 0.0–2.5)
Relative Index: 0.5 (ref 0.0–2.5)
Total CK: 174 U/L (ref 7–177)
Troponin I: <0.3 ng/mL (ref <0.30)

## 2011-07-07 LAB — PROTIME-INR
INR: 1.16 (ref 0.00–1.49)
Prothrombin Time: 15 seconds (ref 11.6–15.2)

## 2011-07-07 MED ORDER — POTASSIUM CHLORIDE CRYS ER 20 MEQ PO TBCR
40.0000 meq | EXTENDED_RELEASE_TABLET | Freq: Once | ORAL | Status: AC
  Administered 2011-07-07: 40 meq via ORAL
  Filled 2011-07-07: qty 2

## 2011-07-07 MED ORDER — POTASSIUM CHLORIDE 10 MEQ/100ML IV SOLN
10.0000 meq | Freq: Once | INTRAVENOUS | Status: AC
  Administered 2011-07-07: 10 meq via INTRAVENOUS
  Filled 2011-07-07: qty 200

## 2011-07-07 MED ORDER — ASPIRIN 300 MG RE SUPP: 300.0000 mg | RECTAL | Status: DC

## 2011-07-07 MED ORDER — IRBESARTAN 300 MG PO TABS
300.0000 mg | ORAL_TABLET | Freq: Every day | ORAL | Status: DC
  Administered 2011-07-07 – 2011-07-13 (×7): 300 mg via ORAL
  Filled 2011-07-07 (×7): qty 1

## 2011-07-07 MED ORDER — ACETAMINOPHEN 325 MG PO TABS
650.0000 mg | ORAL_TABLET | ORAL | Status: DC | PRN
  Administered 2011-07-12: 650 mg via ORAL
  Filled 2011-07-07: qty 2

## 2011-07-07 MED ORDER — POTASSIUM CHLORIDE 10 MEQ/100ML IV SOLN
10.0000 meq | Freq: Once | INTRAVENOUS | Status: AC
  Administered 2011-07-07: 10 meq via INTRAVENOUS
  Filled 2011-07-07: qty 100

## 2011-07-07 MED ORDER — POTASSIUM CHLORIDE 20 MEQ/15ML (10%) PO LIQD
40.0000 meq | Freq: Once | ORAL | Status: AC
  Administered 2011-07-07: 40 meq via ORAL
  Filled 2011-07-07 (×2): qty 15

## 2011-07-07 MED ORDER — ASPIRIN 81 MG PO CHEW
324.0000 mg | CHEWABLE_TABLET | ORAL | Status: AC
  Administered 2011-07-07: 324 mg via ORAL
  Filled 2011-07-07: qty 3
  Filled 2011-07-07: qty 1

## 2011-07-07 MED ORDER — ASPIRIN EC 81 MG PO TBEC
81.0000 mg | DELAYED_RELEASE_TABLET | Freq: Every day | ORAL | Status: DC
  Administered 2011-07-08 – 2011-07-13 (×6): 81 mg via ORAL
  Filled 2011-07-07 (×6): qty 1

## 2011-07-07 MED ORDER — POTASSIUM CHLORIDE 20 MEQ/15ML (10%) PO LIQD
40.0000 meq | Freq: Every day | ORAL | Status: DC
Start: 2011-07-07 — End: 2011-07-07

## 2011-07-07 MED ORDER — POTASSIUM CHLORIDE 10 MEQ/100ML IV SOLN
10.0000 meq | Freq: Once | INTRAVENOUS | Status: AC
  Administered 2011-07-07: 10 meq via INTRAVENOUS

## 2011-07-07 MED ORDER — MAGNESIUM SULFATE 40 MG/ML IJ SOLN
2.0000 g | Freq: Once | INTRAMUSCULAR | Status: AC
  Administered 2011-07-07: 2 g via INTRAVENOUS
  Filled 2011-07-07: qty 50

## 2011-07-07 MED ORDER — POTASSIUM CHLORIDE 20 MEQ/15ML (10%) PO LIQD
40.0000 meq | Freq: Once | ORAL | Status: AC
  Administered 2011-07-07: 40 meq via ORAL
  Filled 2011-07-07: qty 30

## 2011-07-07 MED ORDER — NITROGLYCERIN 0.4 MG SL SUBL: 0.4000 mg | SUBLINGUAL_TABLET | SUBLINGUAL | Status: DC | PRN

## 2011-07-07 MED ORDER — ONDANSETRON HCL 4 MG/2ML IJ SOLN: 4.0000 mg | Freq: Four times a day (QID) | INTRAMUSCULAR | Status: DC | PRN

## 2011-07-07 MED ORDER — FUROSEMIDE 10 MG/ML IJ SOLN
40.0000 mg | Freq: Once | INTRAMUSCULAR | Status: AC
  Administered 2011-07-07: 40 mg via INTRAVENOUS

## 2011-07-07 MED ORDER — HEPARIN SODIUM (PORCINE) 5000 UNIT/ML IJ SOLN
5000.0000 [IU] | Freq: Three times a day (TID) | INTRAMUSCULAR | Status: DC
  Administered 2011-07-07 – 2011-07-09 (×6): 5000 [IU] via SUBCUTANEOUS
  Filled 2011-07-07 (×9): qty 1

## 2011-07-07 MED ORDER — MAGNESIUM SULFATE 50 % IJ SOLN
1.0000 g | Freq: Once | INTRAMUSCULAR | Status: AC
  Administered 2011-07-07: 1 g via INTRAVENOUS
  Filled 2011-07-07: qty 2

## 2011-07-07 MED ORDER — FUROSEMIDE 10 MG/ML IJ SOLN
20.0000 mg | Freq: Once | INTRAMUSCULAR | Status: AC
  Administered 2011-07-07: 20 mg via INTRAVENOUS
  Filled 2011-07-07: qty 2

## 2011-07-07 NOTE — ED Notes (Signed)
[facility] in ED to transfer patient. Vitals 180/84, 91 pulse (normal sinus), 98% on room air. 20 resp. Denies pain.

## 2011-07-07 NOTE — ED Notes (Signed)
Report given to Marisue Humble, RN at [facility].

## 2011-07-07 NOTE — Progress Notes (Signed)
UR Completed. Simmons, Aletheia Tangredi F [phone]  

## 2011-07-07 NOTE — Progress Notes (Signed)
Pt had some SOB earlier and rec'd 1 dose IV Lasix 20 mg. Reviewed repeat BMET - K+ 3.5 and cardiac enzymes are negative. Will give one more dose of 40 meq KDur and recheck in am.

## 2011-07-07 NOTE — Progress Notes (Signed)
INITIAL ADULT NUTRITION ASSESSMENT Date: 07/07/2011   Time: 9:49 AM Reason for Assessment: Nutrition Risk, unt weight loss  ASSESSMENT: Female 66 y.o.  Dx: Polymorphic ventricular tachycardia  Hx:  Past Medical History  Diagnosis Date  . Hypertension   . Pneumonia   . Asthma   . Arthritis     Related Meds:     . amiodarone  300 mg Intravenous Once  . aspirin  324 mg Oral NOW  . aspirin EC  81 mg Oral Daily  . cloNIDine  0.1 mg Oral Once  . heparin  5,000 Units Subcutaneous Q8H  . irbesartan  300 mg Oral Daily  . magnesium sulfate  2 g Intravenous Once  . magnesium sulfate 1 - 4 g bolus IVPB  2 g Intravenous Once  . potassium chloride  10 mEq Intravenous Once  . potassium chloride  10 mEq Intravenous Once  . potassium chloride  10 mEq Intravenous Once  . potassium chloride  40 mEq Oral Once  . potassium chloride SA  40 mEq Oral Once  . potassium chloride  40 mEq Oral Once  . DISCONTD: aspirin  300 mg Rectal NOW  . DISCONTD: potassium chloride  10 mEq Intravenous Q1 Hr x 4  . DISCONTD: potassium chloride  40 mEq Oral Daily     Ht: 5' 1.5" (156.2 cm)  Wt: 179 lb 3.7 oz (81.3 kg)  Ideal Wt: 48.9 kg % Ideal Wt: 166%  Usual Wt: 220 lbs per patient report % Usual Wt: 81%  Body mass index is 33.32 kg/(m^2). Obesity  Food/Nutrition Related Hx: Pt reports poor appetite, N/V with meals, and weight loss for about 1.5 months with "lung infection." Appetite has returned for the last few weeks since that has been resolved, has not gained all the weight back.   Labs:  CMP     Component Value Date/Time   NA 137 07/07/2011 0210   K 2.4* 07/07/2011 0210   CL 94* 07/07/2011 0210   CO2 29 07/07/2011 0210   GLUCOSE 93 07/07/2011 0210   BUN 4* 07/07/2011 0210   CREATININE 0.60 07/07/2011 0210   CALCIUM 8.1* 07/07/2011 0210   PROT 8.2 07/06/2011 2047   ALBUMIN 3.0* 07/06/2011 2047   AST 15 07/06/2011 2047   ALT 7 07/06/2011 2047   ALKPHOS 110 07/06/2011 2047   BILITOT 0.5 07/06/2011  2047   GFRNONAA >90 07/07/2011 0210   GFRAA >90 07/07/2011 0210      Intake/Output Summary (Last 24 hours) at 07/07/11 0951 Last data filed at 07/07/11 9562  Gross per 24 hour  Intake    250 ml  Output    400 ml  Net   -150 ml     Diet Order: Cardiac  Supplements/Tube Feeding: none  IVF:    DISCONTD: amiodarone (NEXTERONE PREMIX) 360 mg/200 mL dextrose Last Rate: 30.06 mg/hr (07/07/11 0100)    Estimated Nutritional Needs:   Kcal: 1550-1700 Protein: 75-85 gm  Fluid:  1.6-1.8 L  Pt reports that while she had the "lung infection" she would vomit after all meals and only liquid she could tolerate was water. This continued for 1.5 months in which she got down to 168 lbs per her report. This would indicate a 52 lbs (24%) weight loss in 1.5 months. Once the lung infection was cleared pt had an increased appetite and no more n/v for the last month leading up to this admission.  Patient meets criteria for severe malnutrition in the context of acute illness 2/2  to weight loss of 24% in about 1.5 months and intake of less then 50% for 1 month while pt had the lung infection.   Some weight loss is desired for this patient, and would not want pt to gain all of her weight back due to her level of obesity. Pt agrees with not starting supplements unless unable to meet needs with meals.   NUTRITION DIAGNOSIS: -Malnutrition (NI-5.2).  Status: Ongoing  RELATED TO: decreased appetite and N/V  AS EVIDENCE BY: 24% weight loss in 1.5 months and less then 50% intake for more then 5 days  MONITORING/EVALUATION(Goals): Goal: appetite will remain adequate to meet needs Monitor: PO intake, weight, labs, I/O's, need for supplements  EDUCATION NEEDS: -No education needs identified at this time  INTERVENTION: 1. RD will not add any nutrition supplements at this time, will continue to monitor for additional needs  Dietitian [phone]  DOCUMENTATION CODES Per approved criteria  -Severe  malnutrition in the context of acute illness or injury -Obesity Unspecified    Clarene Duke MARIE 07/07/2011, 9:49 AM

## 2011-07-07 NOTE — Progress Notes (Signed)
Called to see patient with Dr. Myrtis Ser for SOB by nurse, patient had increased WOB. On exam, patient is comfortable but is SOB. Lung crackles are noted with poor air movement & abdominal bloating. No Taff. Lab and chart reviewed. No arrhythmia at present. However, patient was significantly anemic with leukocytosis/thrombocytosis on admission. Clinically she has CHF. Per Dr. Myrtis Ser will rx with Lasix 40mg  IV x 1, give an additional of KCl elixir with this, and watch carefully. We will also repeat CBC given significant hematologic abnormality and request peripheral smear. Also note she had a chest CT with a new 11mm pulmonary nodule in January 2013 for hemoptysis for which f/u CT was recommended in 4-60mo. TSH is also abnormal so will add free T4 to next lab draw. She may benefit from internal medicine for possible underlying illness driving her clinical picture.   Kolbie Clarkston PA-C

## 2011-07-07 NOTE — Progress Notes (Signed)
Patient Name: Diana Baker Date of Encounter: 07/07/2011  Principal Problem:  *Polymorphic ventricular tachycardia    SUBJECTIVE: Feels better, no CP, breathing better, no palps, tolerating IV K+ OK.  OBJECTIVE  Filed Vitals:   07/07/11 0500 07/07/11 0600 07/07/11 0700 07/07/11 0707  BP: 149/67 129/75 145/76   Pulse: 81 73  83  Temp:    98.2 F (36.8 C)  TempSrc:    Oral  Resp: 21 17  28   Height:      Weight: 179 lb 3.7 oz (81.3 kg)     SpO2: 100% 100%  100%    Intake/Output Summary (Last 24 hours) at 07/07/11 2130 Last data filed at 07/07/11 8657  Gross per 24 hour  Intake    250 ml  Output    400 ml  Net   -150 ml   Weight change:  Wt Readings from Last 3 Encounters:  07/07/11 179 lb 3.7 oz (81.3 kg)     PHYSICAL EXAM  General: Well developed, well nourished, in no acute distress. Head: Normocephalic, atraumatic.  Neck: Supple without bruits, JVD at approx 8 cm Lungs:  Resp regular and unlabored, bibasilar crackles. Heart: RRR, S1, S2, no S3, S4, or murmurs. Abdomen: Soft, non-tender, non-distended, BS + x 4.  Extremities: No clubbing, cyanosis, no edema.  Neuro: Alert and oriented X 3. Moves all extremities spontaneously. Psych: Normal affect.  LABS:  CBC: Basename 07/06/11 2047  WBC 13.6*  NEUTROABS 11.4*  HGB 8.1*  HCT 25.5*  MCV 80.2  PLT 776*   INR: Basename 07/07/11 0210  INR 1.16   Basic Metabolic Panel: Basename 07/07/11 0210 07/06/11 2116 07/06/11 2047  NA 137 -- 136  K 2.4* -- 2.1*  CL 94* -- 90*  CO2 29 -- 31  GLUCOSE 93 -- 100*  BUN 4* -- 4*  CREATININE 0.60 -- 0.68  CALCIUM 8.1* -- 8.9  MG -- 1.8 --  PHOS -- -- --   Liver Function Tests: [facility] 07/06/11 2047  AST 15  ALT 7  ALKPHOS 110  BILITOT 0.5  PROT 8.2  ALBUMIN 3.0*    Cardiac Enzymes: Basename 07/07/11 0210  CKTOTAL 182*  CKMB 0.9  CKMBINDEX --  TROPONINI <0.30   BNP: Pro B Natriuretic peptide (BNP)  Date/Time Value Range Status  07/06/2011  8:47  PM 3172.0* 0-125 (pg/mL) Final    TELE:  SR with multiple episodes of Torsades, but none in the last 4 hours      Radiology/Studies: Dg Chest Port 1 View 07/06/2011  *RADIOLOGY REPORT*  Clinical Data: Short of breath.  Hemoptysis.  PORTABLE CHEST - 1 VIEW  Comparison: 01/31/2011  Findings: Cardiac enlargement with vascular congestion.  Bilateral pleural effusions have developed since the prior study.  Perihilar airspace disease bilaterally suggest fluid overload.  IMPRESSION: Findings are compatible with congestive heart failure with mild edema and small bilateral pleural effusions.  Original Report Authenticated By: Camelia Phenes, M.D.    Current Medications:     . amiodarone  300 mg Intravenous Once d/c'd  . aspirin  324 mg Oral NOW done  . aspirin EC  81 mg Oral Daily  . cloNIDine  0.1 mg Oral Once  . heparin  5,000 Units Subcutaneous Q8H  . irbesartan  300 mg Oral Daily  . magnesium sulfate  2 g Intravenous Once  . magnesium sulfate 1 - 4 g bolus IVPB  2 g Intravenous Once  . potassium chloride  10 mEq Intravenous Once done  .  potassium chloride  10 mEq Intravenous Once going  . potassium chloride  40 mEq Oral Once given  . potassium chloride SA  40 mEq Oral Once given  . potassium chloride  40 mEq Oral Once given  . DISCONTD: potassium chloride  10 mEq Intravenous Q1 Hr x 4    ASSESSMENT AND PLAN: Patient Active Problem List  Diagnoses   Pulm edema - by CXR, Needs Lasix but will wait until K+ improved   Anemia - MCV low-nl, ck iron, poor appetite, guiac stools and follow.   Hypokalemia - supp infusing, recheck pending  . Polymorphic ventricular tachycardia - improving with K+ and Mg supp, cont these and recheck is ordered    Signed, Diana Baker , PA-C 7:12 AM 07/07/2011   History reviewed with the patient, no changes to be made.  The patient exam reveals Lungs: diffuse wheezing, COR RRR, ABD positive bowel sounds, Ext:  No edema.  All available labs, radiology  testing, previous records reviewed. Agree with documented assessment and plan. Thus far 130 meq potassium replacement ordered.  Repeat BMET this pm.  No further ectopy.  Echo pending.   Diana Baker  10:24 AM 02/25/2011

## 2011-07-07 NOTE — Progress Notes (Signed)
On-call note:  Called to evaluate patient because of increase non-sustained episodes of Torsades.  Her baseline QTc is .  Her most recent labs are from 11am with a K of 3.5 and Magnesium of 2.5.  Will check STAT potassium and magnesium level.    A/P: Torsades in the setting of prolonged QTc - In light of her Torsades and prolonged QTc we will avoid amio.  She may need isuprel and or temp pacing wire.  Zoll pads placed on patient.  Medications were reviewed and there are no medications that prolong QTc.    Gwendalyn Ege, M.D. Cardiology

## 2011-07-07 NOTE — H&P (Signed)
Cardiology H&P  Primary Care Povider: Diana Ribas, MD, MD Primary Cardiologist: none   HPI: Ms. Diana Baker is a 66 y.o.female with HTN and chronic lung disease (asthma) who is transferred from [facility] with wide complex tachycardia.  The patient reports recently having been treated for "lung infection" > 1 month ago.  During this time she lost her appetite and had really only been drinking water.  She continued to take her Benicar/HCTZ during this time.  Yesterday she began to have paroxysmal episodes of lightheadedness and palpitations.  They were becoming more frequent today.  She sat on the bed earlier and is not sure but she may have passed out.  She presented to [facility] and was noted to be in narrow complex tachycardia hat quickly converted to NSR.  She then began having paroxysmal VT.  She was placed on amio ggt and transferred to [facility].  Diana Baker denies any chest pain currently or exertional pain in the past.  She felt fine before she began having palpitations today.  She has never had similar symptoms before.  She has not had any recent medication changes.  She has not been on abx for >52month.     Past Medical History  Diagnosis Date  . Hypertension   . Pneumonia   . Asthma   . Arthritis     History reviewed. No pertinent past surgical history.  Family History  Problem Relation Age of Onset  . Coronary artery disease Father     Social History:  reports that she has never smoked. She does not have any smokeless tobacco history on file. She reports that she does not drink alcohol. Her drug history not on file.  Allergies: No Known Allergies  Current Facility-Administered Medications  Medication Dose Route Frequency Provider Last Rate Last Dose  . acetaminophen (TYLENOL) tablet 650 mg  650 mg Oral Q4H PRN Diana Score, MD      . amiodarone (CORDARONE) injection 300 mg  300 mg Intravenous Once Diana Givens, MD   300 mg at 07/06/11 2137  . aspirin chewable tablet 324 mg   324 mg Oral NOW Diana Score, MD      . aspirin EC tablet 81 mg  81 mg Oral Daily Diana Score, MD      . cloNIDine (CATAPRES) tablet 0.1 mg  0.1 mg Oral Once Diana Givens, MD   0.1 mg at 07/06/11 2317  . heparin injection 5,000 Units  5,000 Units Subcutaneous Q8H Diana Score, MD      . irbesartan Evlyn Kanner) tablet 300 mg  300 mg Oral Daily Diana Score, MD      . magnesium sulfate IVPB 2 g 50 mL  2 g Intravenous Once Diana Givens, MD   2 g at 07/06/11 2133  . magnesium sulfate IVPB 2 g 50 mL  2 g Intravenous Once Diana Score, MD      . nitroGLYCERIN (NITROSTAT) SL tablet 0.4 mg  0.4 mg Sublingual Q5 Min x 3 PRN Diana Score, MD      . ondansetron [facility] injection 4 mg  4 mg Intravenous Q6H PRN Diana Score, MD      . potassium chloride 10 mEq in 100 mL IVPB  10 mEq Intravenous Once Diana Score, MD      . potassium chloride 20 MEQ/15ML (10%) liquid 40 mEq  40 mEq Oral Once Diana Score, MD      . potassium chloride SA (K-DUR,KLOR-CON) CR tablet 40 mEq  40 mEq  Oral Once Diana Givens, MD   40 mEq at 07/06/11 2250  . DISCONTD: amiodarone (NEXTERONE PREMIX) 360 mg/200 mL dextrose IV infusion  30 mg/hr Intravenous Continuous Diana Givens, MD 16.7 mL/hr at 07/07/11 0100 30.06 mg/hr at 07/07/11 0100  . DISCONTD: aspirin suppository 300 mg  300 mg Rectal NOW Diana Score, MD      . DISCONTD: potassium chloride 10 mEq in 100 mL IVPB  10 mEq Intravenous Q1 Hr x 4 Diana Givens, MD 100 mL/hr at 07/06/11 2232 10 mEq at 07/06/11 2232  . DISCONTD: potassium chloride 20 MEQ/15ML (10%) liquid 40 mEq  40 mEq Oral Daily Diana Score, MD        ROS: A full review of systems is obtained and is negative except as noted in the HPI.  Physical Exam: Blood pressure 177/82, pulse 37, temperature 98.5 F (36.9 C), temperature source Oral, resp. rate 23, height 5' 1.5" (1.562 m), weight 81.7 kg (180 lb 1.9 oz), SpO2 100.00%.  GENERAL: no acute distress.  EYES: Extra ocular movements are intact. There  is no lid lag. Sclera is anicteric.  ENT: Oropharynx is clear. Dentition is within normal limits.  NECK: Supple. The thyroid is not enlarged.  LYMPH: There are no masses or lymphadenopathy present.  HEART: Regular rate and rhythm with no murmur, S4 present, JVP mildly elevated LUNGS: basilar crackles bilaterally ABDOMEN: Soft, non-tender, and non-distended with normoactive bowel sounds. There is no hepatosplenomegaly.  EXTREMITIES: No clubbing, cyanosis, 1+ pitting edema to knees PULSES:  DP/PT pulses were +2 and equal bilaterally.  SKIN: Warm, dry, and intact.  NEUROLOGIC: The patient was oriented to person, place, and time. No overt neurologic deficits were detected.  PSYCH: Normal judgment and insight, mood is appropriate.   Results: Results for orders placed during the hospital encounter of 07/06/11 (from the past 24 hour(s))  PRO B NATRIURETIC PEPTIDE     Status: Abnormal   Collection Time   07/06/11  8:47 PM      Component Value Range   Pro B Natriuretic peptide (BNP) 3172.0 (*) 0 - 125 (pg/mL)  CBC     Status: Abnormal   Collection Time   07/06/11  8:47 PM      Component Value Range   WBC 13.6 (*) 4.0 - 10.5 (K/uL)   RBC 3.18 (*) 3.87 - 5.11 (MIL/uL)   Hemoglobin 8.1 (*) 12.0 - 15.0 (g/dL)   HCT 40.9 (*) 81.1 - 46.0 (%)   MCV 80.2  78.0 - 100.0 (fL)   MCH 25.5 (*) 26.0 - 34.0 (pg)   MCHC 31.8  30.0 - 36.0 (g/dL)   RDW 91.4 (*) 78.2 - 15.5 (%)   Platelets 776 (*) 150 - 400 (K/uL)  DIFFERENTIAL     Status: Abnormal   Collection Time   07/06/11  8:47 PM      Component Value Range   Neutrophils Relative 84 (*) 43 - 77 (%)   Neutro Abs 11.4 (*) 1.7 - 7.7 (K/uL)   Lymphocytes Relative 9 (*) 12 - 46 (%)   Lymphs Abs 1.3  0.7 - 4.0 (K/uL)   Monocytes Relative 6  3 - 12 (%)   Monocytes Absolute 0.8  0.1 - 1.0 (K/uL)   Eosinophils Relative 1  0 - 5 (%)   Eosinophils Absolute 0.2  0.0 - 0.7 (K/uL)   Basophils Relative 0  0 - 1 (%)   Basophils Absolute 0.0  0.0 - 0.1 (K/uL)    COMPREHENSIVE METABOLIC  PANEL     Status: Abnormal   Collection Time   07/06/11  8:47 PM      Component Value Range   Sodium 136  135 - 145 (mEq/L)   Potassium 2.1 (*) 3.5 - 5.1 (mEq/L)   Chloride 90 (*) 96 - 112 (mEq/L)   CO2 31  19 - 32 (mEq/L)   Glucose, Bld 100 (*) 70 - 99 (mg/dL)   BUN 4 (*) 6 - 23 (mg/dL)   Creatinine, Ser 1.30  0.50 - 1.10 (mg/dL)   Calcium 8.9  8.4 - 86.5 (mg/dL)   Total Protein 8.2  6.0 - 8.3 (g/dL)   Albumin 3.0 (*) 3.5 - 5.2 (g/dL)   AST 15  0 - 37 (U/L)   ALT 7  0 - 35 (U/L)   Alkaline Phosphatase 110  39 - 117 (U/L)   Total Bilirubin 0.5  0.3 - 1.2 (mg/dL)   GFR calc non Af Amer 89 (*) >90 (mL/min)   GFR calc Af Amer >90  >90 (mL/min)  MAGNESIUM     Status: Normal   Collection Time   07/06/11  9:16 PM      Component Value Range   Magnesium 1.8  1.5 - 2.5 (mg/dL)  POCT I-STAT TROPONIN I     Status: Normal   Collection Time   07/06/11  9:32 PM      Component Value Range   Troponin i, poc 0.02  0.00 - 0.08 (ng/mL)   Comment 3             EKG: Initial EKG from [facility] at 2103 shows sinus tachycardia with QT prolongation QTc 580-600, repeat at 2114 shows a fairly narrow complex rhythm but with clear AV disassociation that probably represents PMVT Rhythm strips show prolonged QT with PMVT consistent with Torsades  CXR: cardiomegaly and pulm edema  Assessment/Plan:  66 yo AAF with Asthma and HTN transferred from [facility] with severe hypokalemia and prolonged QT with PMVT consistent with Tors[place].    1. PMVT: suspect this is secondary to hypokalemia but ischemia needs to be ruled out.  She also appears to be in mild heart failure so may have LV dysfunction at baseline. - d/c amiodarone now - replace potassium aggressively - IV magnesium - avoid BB for now to prevent bradycardia - follow cardiac enzymes.  EKG here shows no ischemic changes and pt is pain free - will probably need LHC but I suspect this is torsades due to hypokalemia -  echo in AM 2. Hypokalemia - replace aggressively with IV and PO and follow today - suspect due to decreased PO intake and HCTZ - hold HCTZ 3. Acute exacerbation of CHF:  - continue benicar but d/c hctz - avoid diuresis until potassium completely replaced - will plan to add BB at some point but will wait until QT interval improved - echo in AM, possible LHC as above 4. Anemia: unclear baseline - check iron, B12, folate, retic - may have internal medicine consult tomorrow for this and hypokalemia 5. DVT ppx: heparin 5000 q8  Diana Baker 07/07/2011, 2:06 AM

## 2011-07-07 NOTE — Progress Notes (Signed)
Pt c/o "dizzy spells" having frequent runs of VTACH and PVC's, MD notified and new orders received for STAT mag &   K+ levels, will continue to monitor  Laurena Bering, RN

## 2011-07-07 NOTE — Progress Notes (Signed)
CRITICAL VALUE ALERT  Critical value received:  K  Date of notification:  t  Time of notification:  0305  Critical value read back:yes  Nurse who received alert:  Rocco Pauls RN  MD notified (1st page):  Dr Charm Barges   Time of first page:  0305  MD notified (2nd page):  Time of second page:  Responding MD:  Dr Charm Barges   Time MD responded:  [id number]

## 2011-07-08 DIAGNOSIS — I4891 Unspecified atrial fibrillation: Secondary | ICD-10-CM

## 2011-07-08 DIAGNOSIS — D649 Anemia, unspecified: Secondary | ICD-10-CM

## 2011-07-08 DIAGNOSIS — R55 Syncope and collapse: Secondary | ICD-10-CM

## 2011-07-08 DIAGNOSIS — I509 Heart failure, unspecified: Secondary | ICD-10-CM

## 2011-07-08 DIAGNOSIS — J471 Bronchiectasis with (acute) exacerbation: Secondary | ICD-10-CM | POA: Diagnosis present

## 2011-07-08 LAB — FOLATE RBC: RBC Folate: 506 ng/mL (ref >=366)

## 2011-07-08 LAB — CBC
MCH: 24.5 pg — ABNORMAL LOW (ref 26.0–34.0)
MCHC: 30.7 g/dL (ref 30.0–36.0)
Platelets: 691 10*3/uL — ABNORMAL HIGH (ref 150–400)
RDW: 17.8 % — ABNORMAL HIGH (ref 11.5–15.5)

## 2011-07-08 LAB — BASIC METABOLIC PANEL
BUN: 5 mg/dL — ABNORMAL LOW (ref 6–23)
Calcium: 8.5 mg/dL (ref 8.4–10.5)
GFR calc Af Amer: >90 mL/min (ref >90)
GFR calc non Af Amer: 87 mL/min — ABNORMAL LOW (ref >90)
Glucose, Bld: 94 mg/dL (ref 70–99)

## 2011-07-08 LAB — T4, FREE: Free T4: 1.55 ng/dL (ref 0.80–1.80)

## 2011-07-08 LAB — EXPECTORATED SPUTUM ASSESSMENT W GRAM STAIN, RFLX TO RESP C

## 2011-07-08 MED ORDER — MAGNESIUM OXIDE 400 MG PO TABS
200.0000 mg | ORAL_TABLET | Freq: Every day | ORAL | Status: DC
  Administered 2011-07-08 – 2011-07-12 (×5): 200 mg via ORAL
  Filled 2011-07-08 (×5): qty 0.5

## 2011-07-08 NOTE — Consult Note (Signed)
Name: Diana Baker MRN: [id number] DOB: [birth year]    LOS: 2  PCCM CONSULT NOTE  History of Present Illness: This is a 66 year old African American female admitted for polymorphic ventricular tachycardia. This patient has a history of chronic cough with streaky hemoptysis. The patient is CT scan of the chest obtained in January 2013. This revealed bronchiectasis with scattered pulmonary nodules and scattered peribronchial nodularity. Patient was seen by Dr. Juanetta Gosling at [facility].  No bronchoscopy has been obtained. The patient has received antibiotics. Cardiology asked for pulmonary consultation during current hospitalization.  Lines / Drains: None  Cultures: None  Antibiotics: None  Tests / Events:     Past Medical History  Diagnosis Date  . Hypertension   . Pneumonia   . Asthma   . Arthritis    History reviewed. No pertinent past surgical history. Prior to Admission medications   Medication Sig Start Date End Date Taking? Authorizing Provider  naproxen sodium (ANAPROX) 220 MG tablet Take 440 mg by mouth as needed. FOR PAIN   Yes Historical Provider, MD  olmesartan-hydrochlorothiazide (BENICAR HCT) 40-12.5 MG per tablet Take 1 tablet by mouth daily.   Yes Historical Provider, MD   Allergies No Known Allergies  Family History Family History  Problem Relation Age of Onset  . Coronary artery disease Father     Social History  reports that she has never smoked. She does not have any smokeless tobacco history on file. She reports that she does not drink alcohol. Her drug history not on file.  Review Of Systems  11 points review of systems is negative with an exception of listed in HPI.  Vital Signs: Temp:  [98.1 F (36.7 C)-98.7 F (37.1 C)] 98.6 F (37 C) (03/29 0728) Pulse Rate:  [78-107] 82  (03/29 0728) Resp:  [14-36] 25  (03/29 0728) BP: (125-184)/(57-87) 139/61 mmHg (03/29 0728) SpO2:  [97 %-100 %] 100 % (03/29 0728) Weight:  [79.8 kg (175 lb 14.8  oz)] 79.8 kg (175 lb 14.8 oz) (03/29 0500) I/O last 3 completed shifts: In: 732 [P.O.:480; IV Piggyback:252] Out: 3575 [Urine:3575]  Physical Examination: General:  Awake and alert Neuro:  No focal  deficits   HEENT:  Mucous membranes clear Neck:  No jugular venous distention, no thyromegaly  Cardiovascular:  Regular rate and rhythm, normal S1-S2, no S3 or S4, no murmur Lungs:  Clear to auscultation Abdomen:  Soft nontender, no organomegaly Musculoskeletal:  Full range of motion Skin:  Clear      Labs and Imaging:  Reviewed.  Please refer to the Assessment and Plan section for relevant results. Dg Chest Port 1 View  07/06/2011  *RADIOLOGY REPORT*  Clinical Data: Short of breath.  Hemoptysis.  PORTABLE CHEST - 1 VIEW  Comparison: 01/31/2011  Findings: Cardiac enlargement with vascular congestion.  Bilateral pleural effusions have developed since the prior study.  Perihilar airspace disease bilaterally suggest fluid overload.  IMPRESSION: Findings are compatible with congestive heart failure with mild edema and small bilateral pleural effusions.  Original Report Authenticated By: Camelia Phenes, M.D.   CT CHest 04/22/11 Technique: Multidetector CT imaging of the chest was performed  following the standard protocol during bolus administration of  intravenous contrast.  Contrast: 80mL OMNIPAQUE IOHEXOL 300 MG/ML IV SOLN  Comparison: Chest CT 08/03/2009  Findings: The chest wall is unremarkable. No breast masses,  supraclavicular or axillary lymphadenopathy. There are small  scattered lymph nodes. The bony thorax is intact.  The heart is normal in size. No  pericardial effusion. No  mediastinal or hilar lymphadenopathy. Small scattered lymph nodes  are noted. The aorta is normal in caliber. Mild atherosclerotic  calcifications. No dissection. The esophagus is grossly normal.  Examination of the lung parenchyma demonstrates chronic underlying  bronchitic type changes with peribronchial  thickening, areas of  bronchiectasis and airspace nodularity. There appears to be some  superimposed acute atypical infection/bronchitis with patchy ground-  glass opacity the left upper lobe which may be alveolitis. Patchy  tree in bud appearance and increasing peribronchial thickening and  airspace nodularity. There is a 11 mm nodule in the superior  segment of the right upper lobe on image number 26 which needs  follow-up. No discrete lobar pneumonia. No pleural effusions or  pulmonary edema.  The upper abdomen is unremarkable.  IMPRESSION:  1. Chronic bronchitic changes with peribronchial thickening and  bronchiectasis.  2. Superimposed acute bronchial inflammation and a probable  atypical infectious process as discussed above.  3. New 11 mm right lower lobe pulmonary nodule requires CT follow-  up in 4-6 months.  Assessment and Plan: Patient Active Hospital Problem List: Polymorphic ventricular tachycardia (07/07/2011)   Assessment: Prolonged QTc interval    Plan: Per cardiology, we'll need to await any pulmonary evaluations pending arrhythmia   Pulmonary nodule (07/07/2011)   Assessment: Pulmonary nodules are inflammatory in nature and related to bronchiectasis    Plan: We'll obtain sputum culture and when stable repeat CT scan of the chest, ultimately this patient may need bronchoscopy but may deferred to Dr. Juanetta Gosling was been following this patient   Bronchiectasis with acute exacerbation (07/08/2011)   Assessment: No evidence of active pulmonary compromise at this time    Plan: We'll obtain sputum culture for routine and AFB, will administer flutter valve, hold on nebulized therapy and would obtain CT scan of the chest when able     Shan Levans  M.D. Pulmonary and Critical Care Medicine [facility] Cell: [phone]   Pager: [phone] 07/08/2011, 9:11 AM

## 2011-07-08 NOTE — Progress Notes (Signed)
SUBJECTIVE:  Events of last night noted with dyspnea and Torsades.  She continues to have a cough with some blood tinged sputum.  She feels the palpitations when they occur but she is much less SOB.   PHYSICAL EXAM Filed Vitals:   07/08/11 0356 07/08/11 0500 07/08/11 0700 07/08/11 0728  BP: 125/64   139/61  Pulse: 80  78 82  Temp: 98.4 F (36.9 C)   98.6 F (37 C)  TempSrc: Oral   Oral  Resp: 14  14 25   Height:      Weight:  79.8 kg (175 lb 14.8 oz)    SpO2: 100%  100% 100%   General:  No distress HEENT:  PERRL Lungs:  Basilar crackles, few expiratory wheezing. Heart:  RRR Abdomen:  Positive bowel sounds, no rebound no guarding Extremities:  No edema  LABS: Lab Results  Component Value Date   CKTOTAL 181* 07/07/2011   CKMB 1.0 07/07/2011   TROPONINI <0.30 07/07/2011   Results for orders placed during the hospital encounter of 07/06/11 (from the past 24 hour(s))  CARDIAC PANEL(CRET KIN+CKTOT+MB+TROPI)     Status: Normal   Collection Time   07/07/11  8:20 AM      Component Value Range   Total CK 174  7 - 177 (U/L)   CK, MB 0.8  0.3 - 4.0 (ng/mL)   Troponin I <0.30  <0.30 (ng/mL)   Relative Index 0.5  0.0 - 2.5   BASIC METABOLIC PANEL     Status: Abnormal   Collection Time   07/07/11 11:03 AM      Component Value Range   Sodium 138  135 - 145 (mEq/L)   Potassium 3.5  3.5 - 5.1 (mEq/L)   Chloride 96  96 - 112 (mEq/L)   CO2 31  19 - 32 (mEq/L)   Glucose, Bld 107 (*) 70 - 99 (mg/dL)   BUN 4 (*) 6 - 23 (mg/dL)   Creatinine, Ser 1.61  0.50 - 1.10 (mg/dL)   Calcium 8.6  8.4 - 09.6 (mg/dL)   GFR calc non Af Amer >90  >90 (mL/min)   GFR calc Af Amer >90  >90 (mL/min)  IRON AND TIBC     Status: Abnormal   Collection Time   07/07/11 11:03 AM      Component Value Range   Iron 37 (*) 42 - 135 (ug/dL)   TIBC 045 (*) 409 - 811 (ug/dL)   Saturation Ratios 20  20 - 55 (%)   UIBC 148  125 - 400 (ug/dL)  FERRITIN     Status: Normal   Collection Time   07/07/11 11:03 AM   Component Value Range   Ferritin 275  10 - 291 (ng/mL)  RETICULOCYTES     Status: Abnormal   Collection Time   07/07/11 11:03 AM      Component Value Range   Retic Ct Pct 1.8  0.4 - 3.1 (%)   RBC. 3.14 (*) 3.87 - 5.11 (MIL/uL)   Retic Count, Manual 56.5  19.0 - 186.0 (K/uL)  VITAMIN B12     Status: Normal   Collection Time   07/07/11 11:03 AM      Component Value Range   Vitamin B-12 719  211 - 911 (pg/mL)  MAGNESIUM     Status: Normal   Collection Time   07/07/11 11:03 AM      Component Value Range   Magnesium 2.5  1.5 - 2.5 (mg/dL)  CARDIAC PANEL(CRET KIN+CKTOT+MB+TROPI)  Status: Abnormal   Collection Time   07/07/11  1:42 PM      Component Value Range   Total CK 181 (*) 7 - 177 (U/L)   CK, MB 1.0  0.3 - 4.0 (ng/mL)   Troponin I <0.30  <0.30 (ng/mL)   Relative Index 0.6  0.0 - 2.5   CBC     Status: Abnormal   Collection Time   07/07/11  7:41 PM      Component Value Range   WBC 12.4 (*) 4.0 - 10.5 (K/uL)   RBC 3.35 (*) 3.87 - 5.11 (MIL/uL)   Hemoglobin 8.3 (*) 12.0 - 15.0 (g/dL)   HCT 96.0 (*) 45.4 - 46.0 (%)   MCV 79.4  78.0 - 100.0 (fL)   MCH 24.8 (*) 26.0 - 34.0 (pg)   MCHC 31.2  30.0 - 36.0 (g/dL)   RDW 09.8 (*) 11.9 - 15.5 (%)   Platelets 716 (*) 150 - 400 (K/uL)  TECHNOLOGIST SMEAR REVIEW     Status: Normal   Collection Time   07/07/11  7:41 PM      Component Value Range   Path Review POLYCHROMASIA PRESENT    MAGNESIUM     Status: Normal   Collection Time   07/07/11  9:56 PM      Component Value Range   Magnesium 2.0  1.5 - 2.5 (mg/dL)  BASIC METABOLIC PANEL     Status: Abnormal   Collection Time   07/07/11  9:56 PM      Component Value Range   Sodium 136  135 - 145 (mEq/L)   Potassium 4.1  3.5 - 5.1 (mEq/L)   Chloride 94 (*) 96 - 112 (mEq/L)   CO2 32  19 - 32 (mEq/L)   Glucose, Bld 123 (*) 70 - 99 (mg/dL)   BUN 5 (*) 6 - 23 (mg/dL)   Creatinine, Ser 1.47  0.50 - 1.10 (mg/dL)   Calcium 8.5  8.4 - 82.9 (mg/dL)   GFR calc non Af Amer 89 (*) >90 (mL/min)     GFR calc Af Amer >90  >90 (mL/min)  BASIC METABOLIC PANEL     Status: Abnormal   Collection Time   07/08/11  5:45 AM      Component Value Range   Sodium 140  135 - 145 (mEq/L)   Potassium 3.7  3.5 - 5.1 (mEq/L)   Chloride 99  96 - 112 (mEq/L)   CO2 31  19 - 32 (mEq/L)   Glucose, Bld 94  70 - 99 (mg/dL)   BUN 5 (*) 6 - 23 (mg/dL)   Creatinine, Ser 5.62  0.50 - 1.10 (mg/dL)   Calcium 8.5  8.4 - 13.0 (mg/dL)   GFR calc non Af Amer 87 (*) >90 (mL/min)   GFR calc Af Amer >90  >90 (mL/min)  CBC     Status: Abnormal   Collection Time   07/08/11  5:45 AM      Component Value Range   WBC 10.7 (*) 4.0 - 10.5 (K/uL)   RBC 3.18 (*) 3.87 - 5.11 (MIL/uL)   Hemoglobin 7.8 (*) 12.0 - 15.0 (g/dL)   HCT 86.5 (*) 78.4 - 46.0 (%)   MCV 79.9  78.0 - 100.0 (fL)   MCH 24.5 (*) 26.0 - 34.0 (pg)   MCHC 30.7  30.0 - 36.0 (g/dL)   RDW 69.6 (*) 29.5 - 15.5 (%)   Platelets 691 (*) 150 - 400 (K/uL)    Intake/Output Summary (Last 24 hours) at  07/08/11 0817 Last data filed at 07/08/11 0530  Gross per 24 hour  Intake    482 ml  Output   3175 ml  Net  -2693 ml    ECHO:  EF 50%.  No regional wall motion abnormalities.  ASSESSMENT AND PLAN:  1) Polymorphic ventricular tachycardia:  QTc prolonged.  She will need an EP evaluation.  She will also need a cardiac cath.  Ectopy is improved with magnesium.  Keep magnesium at high end (2.5).  I started PO but she might need occasional IV    2) Pulmonary nodule: I spoke with Dr. Delford Field and they will see her this admission.  I have reviewed the previous CTs.  She will likely need repeat CT this admission but not until she has less ectopy.    3) Anemia:  Falling Hgb.  Fe and TIBC low.  Check stool guaiac.  (She does not report any GI bleed and said that she had stool cards per her primary MD recently but she does not know the results.)  She will need transfusion if she falls further.   4) CHF (congestive heart failure):  EF is OK (low normal).  Follow strict I/Os.   Good urine output yesterday.  5) Thrombocytosis    Rollene Rotunda 07/08/2011 8:17 AM

## 2011-07-09 DIAGNOSIS — M069 Rheumatoid arthritis, unspecified: Secondary | ICD-10-CM

## 2011-07-09 DIAGNOSIS — I313 Pericardial effusion (noninflammatory): Secondary | ICD-10-CM

## 2011-07-09 DIAGNOSIS — R9431 Abnormal electrocardiogram [ECG] [EKG]: Secondary | ICD-10-CM

## 2011-07-09 DIAGNOSIS — R042 Hemoptysis: Secondary | ICD-10-CM

## 2011-07-09 DIAGNOSIS — E876 Hypokalemia: Secondary | ICD-10-CM

## 2011-07-09 LAB — CBC
MCH: 25.2 pg — ABNORMAL LOW (ref 26.0–34.0)
MCHC: 31.3 g/dL (ref 30.0–36.0)
MCHC: 30.6 g/dL (ref 30.0–36.0)
MCV: 79.7 fL (ref 78.0–100.0)
Platelets: 751 10*3/uL — ABNORMAL HIGH (ref 150–400)
RDW: 17.8 % — ABNORMAL HIGH (ref 11.5–15.5)
RDW: 17.7 % — ABNORMAL HIGH (ref 11.5–15.5)
WBC: 9.5 10*3/uL (ref 4.0–10.5)

## 2011-07-09 LAB — BASIC METABOLIC PANEL
BUN: 6 mg/dL (ref 6–23)
Creatinine, Ser: 0.68 mg/dL (ref 0.50–1.10)
GFR calc Af Amer: >90 mL/min (ref >90)
GFR calc non Af Amer: 89 mL/min — ABNORMAL LOW (ref >90)

## 2011-07-09 LAB — MAGNESIUM: Magnesium: 2.2 mg/dL (ref 1.5–2.5)

## 2011-07-09 MED ORDER — FUROSEMIDE 10 MG/ML IJ SOLN
40.0000 mg | Freq: Every day | INTRAMUSCULAR | Status: DC
  Administered 2011-07-09 – 2011-07-12 (×4): 40 mg via INTRAVENOUS
  Filled 2011-07-09 (×4): qty 4

## 2011-07-09 MED ORDER — POTASSIUM CHLORIDE CRYS ER 20 MEQ PO TBCR
40.0000 meq | EXTENDED_RELEASE_TABLET | Freq: Two times a day (BID) | ORAL | Status: AC
  Administered 2011-07-09 (×2): 40 meq via ORAL
  Filled 2011-07-09 (×2): qty 2

## 2011-07-09 NOTE — Progress Notes (Addendum)
Patient ID: Diana Baker, female   DOB: 12/20/1945, 66 y.o.   MRN: [id number]   SUBJECTIVE: The patient is feeling better. She had no significant further tachyarrhythmias last night. She did diuresis from diuretics on the evening of March 28. She is not on a regular diuretic at this time. She's been seen by the pulmonary team. They feel it is likely that her hemoptysis is related to her bronchiectasis. They will plan further followup once her cardiac status is more stable. Her hemoglobin continues to drop. The etiology is not clear. Potassium is low and will be supplemented.   Filed Vitals:   07/09/11 0400 07/09/11 0406 07/09/11 0500 07/09/11 0800  BP: 141/67   132/62  Pulse: 87   74  Temp:  99.4 F (37.4 C)  98.4 F (36.9 C)  TempSrc:    Oral  Resp: 22   15  Height:      Weight:   172 lb 9.9 oz (78.3 kg)   SpO2: 100%   100%    Intake/Output Summary (Last 24 hours) at 07/09/11 1210 Last data filed at 07/09/11 1200  Gross per 24 hour  Intake    920 ml  Output    975 ml  Net    -55 ml    LABS: Basic Metabolic Panel:  Basename 07/09/11 0500 07/08/11 0545 07/07/11 2156  NA 136 140 --  K 3.3* 3.7 --  CL 96 99 --  CO2 31 31 --  GLUCOSE 94 94 --  BUN 6 5* --  CREATININE 0.68 0.73 --  CALCIUM 7.6* 8.5 --  MG 2.2 -- 2.0  PHOS -- -- --   Liver Function Tests:  [facility] 07/06/11 2047  AST 15  ALT 7  ALKPHOS 110  BILITOT 0.5  PROT 8.2  ALBUMIN 3.0*   No results found for this basename: LIPASE:2,AMYLASE:2 in the last 72 hours CBC:  Basename 07/09/11 0500 07/08/11 0545 07/06/11 2047  WBC 9.6 10.7* --  NEUTROABS -- -- 11.4*  HGB 7.3* 7.8* --  HCT 23.3* 25.4* --  MCV 80.3 79.9 --  PLT 672* 691* --   Cardiac Enzymes:  Basename 07/07/11 1342 07/07/11 0820 07/07/11 0210  CKTOTAL 181* 174 182*  CKMB 1.0 0.8 0.9  CKMBINDEX -- -- --  TROPONINI <0.30 <0.30 <0.30   BNP: No components found with this basename: POCBNP:3 D-Dimer: No results found for this basename:  DDIMER:2 in the last 72 hours Hemoglobin A1C: No results found for this basename: HGBA1C in the last 72 hours Fasting Lipid Panel: No results found for this basename: CHOL,HDL,LDLCALC,TRIG,CHOLHDL,LDLDIRECT in the last 72 hours Thyroid Function Tests:  Basename 07/07/11 0210  TSH 5.303*  T4TOTAL --  T3FREE --  Marko Plume --    RADIOLOGY: Dg Chest Port 1 View  07/06/2011  *RADIOLOGY REPORT*  Clinical Data: Short of breath.  Hemoptysis.  PORTABLE CHEST - 1 VIEW  Comparison: 01/31/2011  Findings: Cardiac enlargement with vascular congestion.  Bilateral pleural effusions have developed since the prior study.  Perihilar airspace disease bilaterally suggest fluid overload.  IMPRESSION: Findings are compatible with congestive heart failure with mild edema and small bilateral pleural effusions.  Original Report Authenticated By: Camelia Phenes, M.D.    PHYSICAL EXAM   Patient is oriented to person time and place. Affect is normal. There are family members in the room. There is no jugulovenous distention. Lungs revealed rhonchi. I believe there are still some rales. Cardiac exam reveals S1 and S2. Abdomen is soft. There is  no significant peripheral edema at this time.   TELEMETRY:  I personally reviewed telemetry. There is normal sinus rhythm.   ASSESSMENT AND PLAN:   *Polymorphic ventricular tachycardia    The patient's rhythm was more stable last night. We will treat her potassium and continue to follow her magnesium.   Pulmonary nodule   This is being followed by the pulmonary team.   Anemia    The patient's anemia worsens. Etiology is not clear. Hemoglobin is down to 7.3. With continued decrease in her hemoglobin she will need to receive blood.   CHF (congestive heart failure)    The patient is diaries in. I will give her more Lasix.  I reviewed her echo. I believe she does have a focal wall motion abnormality affecting the base of the inferior wall. Overall her ejection fraction is  50% as stated in the report. She has a slight septal bounce. There is no strong evidence for pericardial constriction but this will have to be kept in mind. We will need to know more about her coronary status also with a focal wall motion abnormality.   Thrombocytosis   Bronchiectasis with acute exacerbation    It is my understanding that it is thought that hemoptysis comes from this.   Hemoptysis   Hypokalemia   Patient will receive more potassium.   Hypomagnesemia    She has received magnesium and continues to get it by mouth. I will check her level again tomorrow.   Rheumatoid arthritis    No change in therapy today.   Pericardial effusion   I reviewed the echo that the patient had. She does have a small pericardial effusion.   Prolonged QT interval   With her prolonged QT interval we are being very careful about electrolyte management and about the choice of medicines.    Willa Rough 07/09/2011 12:10 PM

## 2011-07-10 LAB — CULTURE, RESPIRATORY W GRAM STAIN: Culture: NORMAL

## 2011-07-10 LAB — TYPE AND SCREEN: Antibody Screen: NEGATIVE

## 2011-07-10 LAB — CBC
MCV: 80.9 fL (ref 78.0–100.0)
Platelets: 652 10*3/uL — ABNORMAL HIGH (ref 150–400)
RBC: 3.98 MIL/uL (ref 3.87–5.11)
WBC: 10.6 10*3/uL — ABNORMAL HIGH (ref 4.0–10.5)

## 2011-07-10 LAB — BASIC METABOLIC PANEL
CO2: 30 mEq/L (ref 19–32)
GFR calc non Af Amer: 89 mL/min — ABNORMAL LOW (ref >90)
Glucose, Bld: 88 mg/dL (ref 70–99)
Potassium: 5.1 mEq/L (ref 3.5–5.1)
Sodium: 137 mEq/L (ref 135–145)

## 2011-07-10 NOTE — Progress Notes (Signed)
Patient: Diana Baker Date of Encounter: 07/10/2011, 7:47 AM Admit date: 07/06/2011     Subjective  Responded very well to diuresis yesterday (-2825, only net -915 but got 2 U of blood). Weight down 5 lbs since admission (175 down from 180. Her stated baseline weight is 168.) Feels much better but still weak. No SOB, CP. Legs have no more edema. No hemoptysis overnight.   Objective   Telemetry: NSR, QTc still prolonged at 560 by telemetry. No arrhythmias since 3/29 Physical Exam: Filed Vitals:   07/10/11 0800  BP: 112/52  Pulse: 84  Temp: 98.3 F (36.8 C)  Resp: 24   General: Well developed, well nourished, in no acute distress. Head: Normocephalic, atraumatic, sclera non-icteric, no xanthomas, nares are without discharge.  Neck: Negative for carotid bruits. JVD not elevated. Lungs: Crackles noted R base, otherwise clear without wheezes or rhonchi. Heart: RRR S1 S2 without murmurs, rubs, or gallops.  Abdomen: Soft, non-tender, non-distended with normoactive bowel sounds. No hepatomegaly. No rebound/guarding. No obvious abdominal masses. Msk:  Strength and tone appear normal for age. Extremities: No clubbing or cyanosis. No edema.  Distal pedal pulses are 2+ and equal bilaterally. Neuro: Alert and oriented X 3. Moves all extremities spontaneously. Psych:  Responds to questions appropriately with a normal affect.    Intake/Output Summary (Last 24 hours) at 07/10/11 0747 Last data filed at 07/10/11 0500  Gross per 24 hour  Intake   1910 ml  Output   2825 ml  Net   -915 ml    Inpatient Medications:    . aspirin EC  81 mg Oral Daily  . furosemide  40 mg Intravenous Daily  . irbesartan  300 mg Oral Daily  . magnesium oxide  200 mg Oral Daily  . potassium chloride  40 mEq Oral BID  . DISCONTD: heparin  5,000 Units Subcutaneous Q8H    Labs:  Basename 07/10/11 0530 07/09/11 0500  NA 137 136  K 5.1 3.3*  CL 98 96  CO2 30 31  GLUCOSE 88 94  BUN 7 6  CREATININE 0.69 0.68   CALCIUM 8.5 7.6*  MG 2.2 2.2  PHOS -- --    Basename 07/10/11 0530 07/09/11 1238  WBC 10.6* 9.5  NEUTROABS -- --  HGB 10.3* 7.6*  HCT 32.2* 24.8*  MCV 80.9 79.7  PLT 652* 751*    Basename 07/07/11 1342 07/07/11 0820  CKTOTAL 181* 174  CKMB 1.0 0.8  TROPONINI <0.30 <0.30    Basename 07/07/11 1103  VITAMINB12 719  FOLATE --  FERRITIN 275  TIBC 185*  IRON 37*  RETICCTPCT 1.8    Radiology/Studies:  1. Chest Port 1 View 07/06/2011  *RADIOLOGY REPORT*  Clinical Data: Short of breath.  Hemoptysis.  PORTABLE CHEST - 1 VIEW  Comparison: 01/31/2011  Findings: Cardiac enlargement with vascular congestion.  Bilateral pleural effusions have developed since the prior study.  Perihilar airspace disease bilaterally suggest fluid overload.  IMPRESSION: Findings are compatible with congestive heart failure with mild edema and small bilateral pleural effusions.  Original Report Authenticated By: Camelia Phenes, M.D.     Assessment and Plan   1. Polymorphic VT in the setting of persistent QTc prolongation & severe hypokalemia on admission - No arrhythmia since 07/08/11. K has corrected, actually on the higher side today. Would benefit from EP consultation tomorrow. Note possible WMA on echocardiogram, small pericardiac effusion per Dr. Henrietta Hoover note yesterday upon review of 2D echocardiogram.  2. QTc prolongation - QTc remains  long, unclear etiology even in the setting of corrected lytes. She is not on any medications that would do this. Follow and recommend EP to see.  3. Volume overload/?acute diastolic CHF - EF 50% by echo with possible WMA. Volume overload may have been precipitated by anemia/arrhythmia but may also be a component of hypoalbuminemia given albumin of 3. Will defer Lasix adjustment to MD to make final decision regarding continued diuresis - weight not yet at baseline, some rales RLL. May be able to transition to po maintenance dosing soon.  4. Hypokalemia - K is 5.1 today which  is unusual for her; she has tended to run 2-3's. Unclear what precipitated her hypokalemia to begin. Follow closely.  5. Hemoptysis with abnormal CT scan 04/2011 with new 11mm pulmonary nodule - pulm following but did not see yesterday, appreciate their input. Sputum cx, AFB cx negative thus far. Pending EP eval will likely need further pulm workup to exclude malignancy vs infiltrative process.   6. Bronchiectasis with acute exacerbation - pulm following. Sputum cx with normal flora. No fever.  7. Anemia/leukocytosis/thrombocytosis - s/p 2 U PRBC's yesterday. I am concerned that there is a bigger medical illness bringing all of her hospitalization issues together. She states she has felt poorly for 9 months. FOBT still pending but patient denies any obvious GI losses. Smear is not particularly helpful - toxic granulation, polychromasia, large platelets. Anemia panel suggests possible anemia of chronic disease given normal saturation, low TIBC. May benefit from heme or IM eval.  Signed, Ronie Spies PA-C Patient seen and examined. I agree with the assessment and plan as detailed above. See also my additional thoughts below.  All of the primary issues are outlined in the note above.  Willa Rough, MD, [facility] 07/10/2011

## 2011-07-11 LAB — CBC
Hemoglobin: 11 g/dL — ABNORMAL LOW (ref 12.0–15.0)
MCH: 26.4 pg (ref 26.0–34.0)
RBC: 4.16 MIL/uL (ref 3.87–5.11)
WBC: 11 10*3/uL — ABNORMAL HIGH (ref 4.0–10.5)

## 2011-07-11 LAB — BASIC METABOLIC PANEL
CO2: 28 mEq/L (ref 19–32)
Chloride: 96 mEq/L (ref 96–112)
Glucose, Bld: 96 mg/dL (ref 70–99)
Potassium: 3.8 mEq/L (ref 3.5–5.1)
Sodium: 136 mEq/L (ref 135–145)

## 2011-07-11 MED ORDER — POTASSIUM CHLORIDE CRYS ER 20 MEQ PO TBCR
40.0000 meq | EXTENDED_RELEASE_TABLET | Freq: Two times a day (BID) | ORAL | Status: DC
  Administered 2011-07-11 (×2): 40 meq via ORAL
  Filled 2011-07-11 (×3): qty 2

## 2011-07-11 MED ORDER — SPIRONOLACTONE 12.5 MG HALF TABLET
12.5000 mg | ORAL_TABLET | Freq: Every day | ORAL | Status: DC
  Administered 2011-07-11 – 2011-07-12 (×2): 12.5 mg via ORAL
  Filled 2011-07-11 (×2): qty 1

## 2011-07-11 NOTE — Progress Notes (Signed)
   CARE MANAGEMENT NOTE 07/11/2011  Patient:  Diana Baker, Diana Baker   Account Number:  [id number]  Date Initiated:  07/11/2011  Documentation initiated by:  GRAVES-BIGELOW,Arryn Terrones  Subjective/Objective Assessment:   Tx from [facility] admitted with wide complex tachycardia. Per MD notes plan for probable cath on Wednesday.     Action/Plan:   Anticipated DC Date:  07/15/2011   Anticipated DC Plan:  HOME/SELF CARE      DC Planning Services  CM consult      Choice offered to / List presented to:             Status of service:  In process, will continue to follow Medicare Important Message given?   (If response is "NO", the following Medicare IM given date fields will be blank) Date Medicare IM given:   Date Additional Medicare IM given:    Discharge Disposition:    Per UR Regulation:    If discussed at Long Length of Stay Meetings, dates discussed:    Comments:  07-11-11 1230 Tomi Bamberger, RN,BSN [phone] CM will continue to monitor post procedure for d/c disposition needs.

## 2011-07-11 NOTE — Progress Notes (Signed)
HPI:  This is a 66 year old African American female admitted for polymorphic ventricular tachycardia. This patient has a history of chronic cough with streaky hemoptysis. The patient is CT scan of the chest obtained in January 2013. This revealed bronchiectasis with scattered pulmonary nodules and scattered peribronchial nodularity. Patient was seen by Dr. Juanetta Gosling at [facility]. No bronchoscopy has been obtained. The patient has received antibiotics. Cardiology asked for pulmonary consultation during current hospitalization.  Antibiotics:   None  Cultures/Sepsis Markers:   None  Access/Protocols:  PIV  Best Practice: DVT:  GI:   Subjective: No events overnight.  Physical Exam: Filed Vitals:   07/11/11 0731  BP: 142/65  Pulse: 94  Temp: 98.1 F (36.7 C)  Resp: 21    Intake/Output Summary (Last 24 hours) at 07/11/11 0928 Last data filed at 07/11/11 0600  Gross per 24 hour  Intake    480 ml  Output   2850 ml  Net  -2370 ml   Neuro: Alert and oriented, moving all ext to command. Cardiac: RRR, Nl S1/S2, -M/R/G. Pulmonary: Diffuse end exp wheezes. GI: Soft, NT, ND and +BS. Extremities: -edema and -tenderness.  Labs: CBC    Component Value Date/Time   WBC 11.0* 07/11/2011 0455   RBC 4.16 07/11/2011 0455   HGB 11.0* 07/11/2011 0455   HCT 33.7* 07/11/2011 0455   PLT 703* 07/11/2011 0455   MCV 81.0 07/11/2011 0455   MCH 26.4 07/11/2011 0455   MCHC 32.6 07/11/2011 0455   RDW 17.0* 07/11/2011 0455   LYMPHSABS 1.3 07/06/2011 2047   MONOABS 0.8 07/06/2011 2047   EOSABS 0.2 07/06/2011 2047   BASOSABS 0.0 07/06/2011 2047   BMET    Component Value Date/Time   NA 136 07/11/2011 0455   K 3.8 07/11/2011 0455   CL 96 07/11/2011 0455   CO2 28 07/11/2011 0455   GLUCOSE 96 07/11/2011 0455   BUN 10 07/11/2011 0455   CREATININE 0.79 07/11/2011 0455   CALCIUM 8.9 07/11/2011 0455   GFRNONAA 85* 07/11/2011 0455   GFRAA >90 07/11/2011 0455   ABG    Component Value Date/Time   PHART 7.440* 05/10/2011 0930     PCO2ART 38.7 05/10/2011 0930   PO2ART 65.6* 05/10/2011 0930   HCO3 25.8* 05/10/2011 0930   TCO2 23.7 05/10/2011 0930   O2SAT 93.0 05/10/2011 0930     Lab 07/10/11 0530  MG 2.2   Lab Results  Component Value Date   CALCIUM 8.9 07/11/2011    Chest Xray:   Assessment & Plan: Polymorphic ventricular tachycardia (07/07/2011) Assessment: Prolonged QTc interval  Plan: Per cardiology, we'll need to await any pulmonary evaluations pending arrhythmia.   Pulmonary nodule (07/07/2011) Assessment: Pulmonary nodules are inflammatory in nature and related to bronchiectasis  Plan: We'll obtain sputum culture and when stable repeat CT scan of the chest, ultimately this patient may need bronchoscopy but may deferred to Dr. Juanetta Gosling was been following this patient as outpatient as I am unable to see images at this time.  Bronchiectasis with acute exacerbation (07/08/2011) Assessment: No evidence of active pulmonary compromise at this time  Plan: F/U on sputum culture, no need for AFB, patient is low risk and imaging is not suggestive.  Hemoptysis: likely due to bronchiectasis, will control cough and assist with airway clearance but no need for abx or bronchoscopy at this time.  Reportedly the mass was 11 mm on CT report.  Will need F/U CT in 3 months and F/U with Dr. Juanetta Gosling in [place] who  is the patient's primary pulmonologist.  If unable to F/U with Dr. Juanetta Gosling the patient is welcomed to F/U with [place] in [facility] PCCM office 2 wks post discharge.  PCCM signing off, please call back if needed.   Koren Bound, MD [phone]

## 2011-07-11 NOTE — Progress Notes (Signed)
Notified by [facility] about concern for Diana Baker about possible airborne precautions. I notified CCM Dr Ignacia Marvel about the concern and he didn't need to see a need to change plan of care at this time. Spoke with karen at In[facility] about the update from Dr Ignacia Marvel. Will continue to monitor.   Diana Baker, Charlaine Dalton RN

## 2011-07-11 NOTE — Progress Notes (Signed)
Patient Name: Diana Baker      SUBJECTIVE: feeling better  Past Medical History  Diagnosis Date  . Hypertension   . Pneumonia   . Asthma   . Arthritis     PHYSICAL EXAM Filed Vitals:   07/10/11 1925 07/10/11 2330 07/11/11 0319 07/11/11 0731  BP:  143/71 136/62 142/65  Pulse:  90  94  Temp: 98.7 F (37.1 C) 97.6 F (36.4 C) 98.5 F (36.9 C) 98.1 F (36.7 C)  TempSrc: Oral Oral Oral Oral  Resp:  21  21  Height:      Weight:   167 lb 5.3 oz (75.9 kg)   SpO2:  97%  95%    Well developed and nourished in no acute distress HENT normal Neck supple with JVP-flat Carotids brisk and full without bruits Clear Regular rate and rhythm, no murmurs or gallops Abd-soft with active BS without hepatomegaly No Clubbing cyanosis edema Skin-warm and dry A & Oriented  Grossly normal sensory and motor function   TELEMETRY: Reviewed telemetry pt in sionus NO further significant ectopy and no vt:    Intake/Output Summary (Last 24 hours) at 07/11/11 1037 Last data filed at 07/11/11 0900  Gross per 24 hour  Intake    480 ml  Output   2800 ml  Net  -2320 ml    LABS: Basic Metabolic Panel:  Lab 07/11/11 1610 07/10/11 0530 07/09/11 0500 07/08/11 0545 07/07/11 2156 07/07/11 1103 07/07/11 0210  NA 136 137 136 140 136 138 137  K 3.8 5.1 3.3* 3.7 4.1 3.5 2.4*  CL 96 98 96 99 94* 96 94*  CO2 28 30 31 31  32 31 29  GLUCOSE 96 88 94 94 123* 107* 93  BUN 10 7 6  5* 5* 4* 4*  CREATININE 0.79 0.69 0.68 0.73 0.68 0.61 0.60  CALCIUM 8.9 8.5 -- -- -- -- --  MG -- 2.2 2.2 -- -- -- --  PHOS -- -- -- -- -- -- --   Cardiac Enzymes: No results found for this basename: CKTOTAL:3,CKMB:3,CKMBINDEX:3,TROPONINI:3 in the last 72 hours CBC:  Lab 07/11/11 0455 07/10/11 0530 07/09/11 1238 07/09/11 0500 07/08/11 0545 07/07/11 1941 07/06/11 2047  WBC 11.0* 10.6* 9.5 9.6 10.7* 12.4* 13.6*  NEUTROABS -- -- -- -- -- -- 11.4*  HGB 11.0* 10.3* 7.6* 7.3* 7.8* 8.3* 8.1*  HCT 33.7* 32.2* 24.8* 23.3*  25.4* 26.6* 25.5*  MCV 81.0 80.9 79.7 80.3 79.9 79.4 80.2  PLT 703* 652* 751* 672* 691* 716* 776*   Echo : Study Conclusions  - Left ventricle: Abnormal septal motion The cavity size was normal. Wall thickness was normal. The estimated ejection fraction was 50%. - Mitral valve: Mild regurgitation. - Left atrium: The atrium was mildly dilated.      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

## 2011-07-12 LAB — BASIC METABOLIC PANEL
CO2: 27 mEq/L (ref 19–32)
Calcium: 9.7 mg/dL (ref 8.4–10.5)
Creatinine, Ser: 0.91 mg/dL (ref 0.50–1.10)
Glucose, Bld: 106 mg/dL — ABNORMAL HIGH (ref 70–99)

## 2011-07-12 LAB — CBC
HCT: 36.4 % (ref 36.0–46.0)
Hemoglobin: 11.9 g/dL — ABNORMAL LOW (ref 12.0–15.0)
MCH: 26.5 pg (ref 26.0–34.0)
MCV: 81.1 fL (ref 78.0–100.0)
RBC: 4.49 MIL/uL (ref 3.87–5.11)

## 2011-07-12 MED ORDER — SPIRONOLACTONE 25 MG PO TABS
25.0000 mg | ORAL_TABLET | Freq: Every day | ORAL | Status: DC
  Administered 2011-07-13: 25 mg via ORAL
  Filled 2011-07-12: qty 1

## 2011-07-12 MED ORDER — POTASSIUM CHLORIDE CRYS ER 20 MEQ PO TBCR
40.0000 meq | EXTENDED_RELEASE_TABLET | Freq: Every day | ORAL | Status: DC
  Administered 2011-07-12: 40 meq via ORAL

## 2011-07-12 NOTE — Progress Notes (Signed)
Patient Name: Diana Baker      SUBJECTIVE: feeling better  Past Medical History  Diagnosis Date  . Hypertension   . Pneumonia   . Asthma   . Arthritis     PHYSICAL EXAM Filed Vitals:   07/12/11 0355 07/12/11 0401 07/12/11 0500 07/12/11 0811  BP: 107/55   118/58  Pulse: 83   82  Temp:  98.3 F (36.8 C)  98.2 F (36.8 C)  TempSrc:  Oral  Oral  Resp: 18   19  Height:      Weight:   167 lb 5.3 oz (75.9 kg)   SpO2: 95%   97%    Well developed and nourished in no acute distress HENT normal Neck supple with JVP-flat Carotids brisk and full without bruits Clear Regular rate and rhythm, no murmurs or gallops Abd-soft with active BS without hepatomegaly No Clubbing cyanosis edema Skin-warm and dry A & Oriented  Grossly normal sensory and motor function   TELEMETRY: Reviewed telemetry pt in sinus NO further significant ectopy and no vt:    Intake/Output Summary (Last 24 hours) at 07/12/11 1136 Last data filed at 07/12/11 1100  Gross per 24 hour  Intake    720 ml  Output   1350 ml  Net   -630 ml    LABS: Basic Metabolic Panel:  Lab 07/12/11 1610 07/11/11 0455 07/10/11 0530 07/09/11 0500 07/08/11 0545 07/07/11 2156 07/07/11 1103  NA 136 136 137 136 140 136 138  K 5.0 3.8 5.1 3.3* 3.7 4.1 3.5  CL 96 96 98 96 99 94* 96  CO2 27 28 30 31 31  32 31  GLUCOSE 106* 96 88 94 94 123* 107*  BUN 15 10 7 6  5* 5* 4*  CREATININE 0.91 0.79 0.69 0.68 0.73 0.68 0.61  CALCIUM 9.7 8.9 -- -- -- -- --  MG -- -- 2.2 2.2 -- -- --  PHOS -- -- -- -- -- -- --   Cardiac Enzymes: No results found for this basename: CKTOTAL:3,CKMB:3,CKMBINDEX:3,TROPONINI:3 in the last 72 hours CBC:  Lab 07/12/11 0550 07/11/11 0455 07/10/11 0530 07/09/11 1238 07/09/11 0500 07/08/11 0545 07/07/11 1941 07/06/11 2047  WBC 13.3* 11.0* 10.6* 9.5 9.6 10.7* 12.4* --  NEUTROABS -- -- -- -- -- -- -- 11.4*  HGB 11.9* 11.0* 10.3* 7.6* 7.3* 7.8* 8.3* --  HCT 36.4 33.7* 32.2* 24.8* 23.3* 25.4* 26.6* --  MCV  81.1 81.0 80.9 79.7 80.3 79.9 79.4 --  PLT 848* 703* 652* 751* 672* 691* 716* --   Echo : Study Conclusions  - Left ventricle: Abnormal septal motion The cavity size was normal. Wall thickness was normal. The estimated ejection fraction was 50%. - Mitral valve: Mild regurgitation. - Left atrium: The atrium was mildly dilated.      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

## 2011-07-12 NOTE — Progress Notes (Signed)
Pt K+ 5.0 today. On K+ 40 meq bid and Aldactone added yest. Will decrease K+ to 40 meq qd and follow BMET.

## 2011-07-12 NOTE — Progress Notes (Signed)
HPI:  This is a 66 year old African American female admitted for polymorphic ventricular tachycardia. This patient has a history of chronic cough with streaky hemoptysis. The patient is CT scan of the chest obtained in January 2013. This revealed bronchiectasis with scattered pulmonary nodules and scattered peribronchial nodularity. Patient was seen by Dr. Juanetta Gosling at [facility]. No bronchoscopy has been obtained. The patient has received antibiotics. Cardiology asked for pulmonary consultation during current hospitalization.  Antibiotics:   None  Cultures/Sepsis Markers:   None  Access/Protocols:  PIV  Best Practice: DVT:  GI:   Subjective: No events overnight.  Physical Exam: Filed Vitals:   07/12/11 1240  BP: 104/55  Pulse: 91  Temp: 98.1 F (36.7 C)  Resp: 21    Intake/Output Summary (Last 24 hours) at 07/12/11 1310 Last data filed at 07/12/11 1100  Gross per 24 hour  Intake    720 ml  Output   1350 ml  Net   -630 ml   Neuro: Alert and oriented, moving all ext to command. Cardiac: RRR, Nl S1/S2, -M/R/G. Pulmonary: Diffuse end exp wheezes. GI: Soft, NT, ND and +BS. Extremities: -edema and -tenderness.  Labs: CBC    Component Value Date/Time   WBC 13.3* 07/12/2011 0550   RBC 4.49 07/12/2011 0550   HGB 11.9* 07/12/2011 0550   HCT 36.4 07/12/2011 0550   PLT 848* 07/12/2011 0550   MCV 81.1 07/12/2011 0550   MCH 26.5 07/12/2011 0550   MCHC 32.7 07/12/2011 0550   RDW 17.3* 07/12/2011 0550   LYMPHSABS 1.3 07/06/2011 2047   MONOABS 0.8 07/06/2011 2047   EOSABS 0.2 07/06/2011 2047   BASOSABS 0.0 07/06/2011 2047   BMET    Component Value Date/Time   NA 136 07/12/2011 0550   K 5.0 07/12/2011 0550   CL 96 07/12/2011 0550   CO2 27 07/12/2011 0550   GLUCOSE 106* 07/12/2011 0550   BUN 15 07/12/2011 0550   CREATININE 0.91 07/12/2011 0550   CALCIUM 9.7 07/12/2011 0550   GFRNONAA 64* 07/12/2011 0550   GFRAA 75* 07/12/2011 0550   ABG    Component Value Date/Time   PHART 7.440* 05/10/2011 0930    PCO2ART 38.7 05/10/2011 0930   PO2ART 65.6* 05/10/2011 0930   HCO3 25.8* 05/10/2011 0930   TCO2 23.7 05/10/2011 0930   O2SAT 93.0 05/10/2011 0930     Lab 07/10/11 0530  MG 2.2   Lab Results  Component Value Date   CALCIUM 9.7 07/12/2011    Chest Xray:   Assessment & Plan: Polymorphic ventricular tachycardia (07/07/2011) Assessment: Prolonged QTc interval  Plan: Per cardiology, we'll need to await any pulmonary evaluations pending arrhythmia.   Pulmonary nodule (07/07/2011) Assessment: Pulmonary nodules are inflammatory in nature and related to bronchiectasis  Plan: We'll obtain sputum culture and when stable repeat CT scan of the chest, ultimately this patient may need bronchoscopy but may deferred to Dr. Juanetta Gosling was been following this patient as outpatient as I am unable to see images at this time.  Bronchiectasis with acute exacerbation (07/08/2011) Assessment: No evidence of active pulmonary compromise at this time  Plan: F/U on sputum culture, no need for AFB, patient is low risk and imaging is not suggestive.  Hemoptysis: likely due to bronchiectasis, will control cough and assist with airway clearance but no need for abx or bronchoscopy at this time.  Reportedly the mass was 11 mm on CT report.  Will need F/U CT in 3 months and F/U with Dr. Juanetta Gosling in [place] who  is the patient's primary pulmonologist.  If unable to F/U with Dr. Juanetta Gosling the patient is welcomed to F/U with [place] in [facility] PCCM office 2 wks post discharge.  PCCM signing off, please call back if needed.   Koren Bound, MD [phone]

## 2011-07-12 NOTE — Clinical Documentation Improvement (Signed)
CHF DOCUMENTATION CLARIFICATION QUERY  THIS DOCUMENT IS NOT A PERMANENT PART OF THE MEDICAL RECORD  TO RESPOND TO THE THIS QUERY, FOLLOW THE INSTRUCTIONS BELOW:  1. If needed, update documentation for the patient's encounter via the notes activity.  2. Access this query again and click edit on the In [employer].  3. After updating, or not, click F2 to complete all highlighted (required) fields concerning your review. Select "additional documentation in the medical record" OR "no additional documentation provided".  4. Click Sign note button.  5. The deficiency will fall out of your In Basket *Please let us know if you are not able to complete this workflow by phone or e-mail (listed below).  Please update your documentation within the medical record to reflect your response to this query.                                                                                    07/12/11  Dear [facility]. [facility],  In a better effort to capture your patient's severity of illness, reflect appropriate length of stay and utilization of resources, a review of the patient medical record has revealed the following indicators the diagnosis of Heart Failure.    Based on your clinical judgment, please clarify TYPE & ACUITY and document in a progress note and/or discharge summary the clinical condition associated with the following supporting information:  In responding to this query please exercise your independent judgment.  The fact that a query is asked, does not imply that any particular answer is desired or expected.  Possible Clinical Conditions?  Chronic Systolic Congestive Heart Failure Chronic Diastolic Congestive Heart Failure Chronic Systolic & Diastolic Congestive Heart Failure Acute Systolic Congestive Heart Failure Acute Diastolic Congestive Heart Failure Acute Systolic & Diastolic Congestive Heart Failure Acute on Chronic Systolic Congestive Heart Failure Acute on Chronic Diastolic  Congestive Heart Failure Acute on Chronic Systolic & Diastolic  Congestive Heart Failure Other Condition________________________________________ Cannot Clinically Determine  Supporting Information:  Risk Factors:(As per notes) "CHF (congestive heart failure) (07/07/2011) "  Signs & Symptoms: (As per notes)"CHF (congestive heart failure)"    "The patient is diaries in. I will give her more Lasix.  I reviewed her echo. I believe she does have a focal wall motion abnormality affecting the base of the inferior wall. Overall her ejection fraction is 50% as stated in the report. She has a slight septal bounce. There is no strong evidence for pericardial constriction but this will have to be kept in mind. We will need to know more about her coronary status also with a focal wall motion abnormality."  Diagnostics:  07-07-11 Total CK 7 - 181 (H)   07-07-11 Echo : Study Conclusions  - Left ventricle: Abnormal septal motion The cavity size was normal. Wall thickness was normal. The estimated ejection fraction was 50%. - Mitral valve: Mild regurgitation. - Left atrium: The atrium was mildly dilated.  Treatment:  LASIX  Reviewed: additional documentation in the medical record  Thank Bonita Quin  Joanette Gula [facility] RN,BSN  Clinical Documentation Specialist: [phone] Pager Health Information Management [place]

## 2011-07-13 ENCOUNTER — Other Ambulatory Visit: Payer: Self-pay

## 2011-07-13 DIAGNOSIS — I472 Ventricular tachycardia: Principal | ICD-10-CM

## 2011-07-13 LAB — CBC
HCT: 37 % (ref 36.0–46.0)
MCH: 26 pg (ref 26.0–34.0)
MCV: 82.8 fL (ref 78.0–100.0)
Platelets: 753 10*3/uL — ABNORMAL HIGH (ref 150–400)
RDW: 17.4 % — ABNORMAL HIGH (ref 11.5–15.5)

## 2011-07-13 LAB — BASIC METABOLIC PANEL
BUN: 22 mg/dL (ref 6–23)
CO2: 24 mEq/L (ref 19–32)
Calcium: 9.6 mg/dL (ref 8.4–10.5)
Creatinine, Ser: 1.05 mg/dL (ref 0.50–1.10)
GFR calc Af Amer: 63 mL/min — ABNORMAL LOW (ref >90)

## 2011-07-13 MED ORDER — ASPIRIN 81 MG PO TBEC: 81.0000 mg | DELAYED_RELEASE_TABLET | Freq: Every day | ORAL | Status: AC

## 2011-07-13 MED ORDER — IRBESARTAN 300 MG PO TABS: 300.0000 mg | ORAL_TABLET | Freq: Every day | ORAL | Status: DC

## 2011-07-13 MED ORDER — SPIRONOLACTONE 25 MG PO TABS: 25.0000 mg | ORAL_TABLET | Freq: Every day | ORAL | Status: DC

## 2011-07-13 NOTE — Progress Notes (Signed)
Cosign for Ninetta Lights RN assessment, med admin, care plan/pathway, notes, and I/O.

## 2011-07-13 NOTE — Progress Notes (Addendum)
Iv removed Tele remove.  dc instructions and med instruction discussed. All questions answered Pt states understanding. Pt wheel chaired out with staff. Pt DC to home.

## 2011-07-13 NOTE — Discharge Summary (Signed)
Pt adimitted with torsades de pointes 2/2 hypokalemia  situationnormalized prior to discharge

## 2011-07-13 NOTE — Progress Notes (Addendum)
Living better with HF packet giving to Pt. Discussed importance of daily weights, low Na diet, increasing activity and Sign and Sym when to call MD. All questions answered, Pt and Family states understanding. Will continue to educate

## 2011-07-13 NOTE — Discharge Summary (Signed)
CARDIOLOGY DISCHARGE SUMMARY   Patient ID: Diana Baker MRN: [id number] DOB/AGE: 08-17-1945 66 y.o.  Admit date: 07/06/2011 Discharge date: 07/13/2011  Primary Discharge Diagnosis:   . Polymorphic ventricular tachycardia   Secondary Discharge Diagnosis:  Patient Active Problem List  Diagnoses     . Pulmonary nodule  . Anemia  . CHF (congestive heart failure)  . Thrombocytosis  . Bronchiectasis with acute exacerbation  . Hemoptysis  . Hypokalemia  . Hypomagnesemia  . Rheumatoid arthritis  . Pericardial effusion  . Prolonged QT interval    Consults: Pulmonary/critical care medicine  Procedures: 2-D echocardiogram  Hospital Course: Diana Baker is a 66 year old female with no previous cardiac issues. She had been sick recently and because of the acute illness, had been drinking only water, poor by mouth intake. She had continued to take her Benicar/HCTZ. She had palpitations and a lightheaded feeling with possible syncope. She went to [facility] emergency room and was noted to have paroxysmal polymorphic VT. She was transferred to [facility] cone for further evaluation and treatment.  Her potassium was as low as 2.0. She was extensively supplemented with both oral and IV medication. Her magnesium was slightly low 1.8 and was supplemented also. Her cardiac enzymes were cycled. She had some mild elevation in her CK but her MB and troponins were all negative. Her BNP was mildly elevated at 3172. She was anemic with a hemoglobin of 8.1. This dropped as low as 7.3 and she was transfused up to 11.9. Her TSH was mildly elevated at 5.303 with a free T4 was within normal limits. Her iron level was mildly low at 37.  Her prior-BNP was elevated and she also had some pulmonary edema on her chest x-ray. Once her potassium had been supplemented, she was given some IV Lasix. She required more doses of IV Lasix, especially with the transfusions. She was diuresed and her oxygen levels improved. Her QTC was  noted noted to be prolonged on ECG. She was not on any QTC-prolonging medications.   She had a cough with some blood-tinged sputum. A pulmonary consult was called and she was evaluated by Diana Baker. She had pulmonary nodules that he felt were related to bronchiectasis and were inflammatory. He recommended a sputum culture and a repeat CT of the chest when she is more stable. He felt that she might ultimately need bronchoscopy but deferred this to Diana Baker. The sputum culture was negative, with normal flora. It was recommended that she get a followup CT in 3 months. This can be arranged by Diana Baker in [place].  Diana Baker recommended ischemic evaluation with an outpatient Myoview to exclude ischemia as a cause of her PT. As her condition improved, she was started on Aldactone. Her potassium will be followed closely on this medication.  By 07/13/2011, Diana Baker was ambulating without chest pain or shortness of breath. She was having no more VT. Her respiratory status had improved and her oxygen saturation was 96% on room air. She was evaluated by Diana Baker and considered stable for discharge, to followup as an outpatient.    Labs:   Lab Results  Component Value Date   WBC 11.1* 07/13/2011   HGB 11.6* 07/13/2011   HCT 37.0 07/13/2011   MCV 82.8 07/13/2011   PLT 753* 07/13/2011    Lab 07/13/11 0650 07/06/11 2047  NA 134* --  K 5.0 --  CL 97 --  CO2 24 --  BUN 22 --  CREATININE 1.05 --  CALCIUM  9.6 --  PROT -- 8.2  BILITOT -- 0.5  ALKPHOS -- 110  ALT -- 7  AST -- 15  GLUCOSE 101* --   Lab Results  Component Value Date   CKTOTAL 181* 07/07/2011   CKMB 1.0 07/07/2011   TROPONINI <0.30 07/07/2011     Pro B Natriuretic peptide (BNP)  Date/Time Value Range Status  07/06/2011  8:47 PM 3172.0* 0-125 (pg/mL) Final   Radiology: Dg Chest Port 1 View 07/06/2011  *RADIOLOGY REPORT*  Clinical Data: Short of breath.  Hemoptysis.  PORTABLE CHEST - 1 VIEW  Comparison: 01/31/2011  Findings: Cardiac  enlargement with vascular congestion.  Bilateral pleural effusions have developed since the prior study.  Perihilar airspace disease bilaterally suggest fluid overload.  IMPRESSION: Findings are compatible with congestive heart failure with mild edema and small bilateral pleural effusions.  Original Report Authenticated By: Camelia Phenes, M.D.    EKG: 13-Jul-2011 04:41:39  Normal sinus rhythm Prolonged QT Abnormal ECG Vent. rate 81 BPM PR interval 126 ms QRS duration 80 ms QT/QTc 454/527 ms P-R-T axes 43 82 65  Echo: 07/07/2011  Study Conclusions - Left ventricle: Abnormal septal motion The cavity size was normal. Wall thickness was normal. The estimated ejection fraction was 50%. - Mitral valve: Mild regurgitation. - Left atrium: The atrium was mildly dilated.    FOLLOW UP PLANS AND APPOINTMENTS Discharge Orders    Future Appointments: Provider: Department: Dept Phone: Center:   07/20/2011 8:45 AM [facility]  None   07/20/2011 10:10 AM [facility] [employer] [phone] [facility]   08/02/2011 8:30 AM Diana Lecher, PA [facility] [phone] [facility]     Future Orders Please Complete By Expires   Diet - low sodium heart healthy      Increase activity slowly        No Known Allergies Medication List  As of 07/13/2011  1:01 PM   STOP taking these medications         olmesartan-hydrochlorothiazide 40-12.5 MG per tablet         TAKE these medications         aspirin 81 MG EC tablet   Take 1 tablet (81 mg total) by mouth daily.      irbesartan 300 MG tablet   Commonly known as: AVAPRO   Take 1 tablet (300 mg total) by mouth daily.      naproxen sodium 220 MG tablet   Commonly known as: ANAPROX   Take 440 mg by mouth as needed. FOR PAIN      spironolactone 25 MG tablet   Commonly known as: ALDACTONE   Take 1 tablet (25 mg total) by mouth daily.           Follow-up Information    Follow up with  Diana Ribas, MD in 2 weeks. (As needed)       Follow up with Diana Newcomer, PA. (See for Dr Graciela Baker on April 22nd at  8:30 am)    Contact information:   [address] [place] [address] [phone]          BRING ALL MEDICATIONS WITH YOU TO FOLLOW UP APPOINTMENTS  Time spent with patient to include physician time: 43 min Signed: Theodore Demark 07/13/2011, 1:01 PM Co-Sign MD

## 2011-07-13 NOTE — Discharge Instructions (Signed)
  You have a Stress Test scheduled on  April 10th at 8:45 at [facility]  No food/drink after midnight before. No caffeine/decaf products 24hr before, including meds such as Excedrin or Goody Powders. Call if there are any questions. OK to take am meds with a sip of water. Arrive about 15 min early for paperwork. Wear comfortable clothes and shoes. Do NOT take: Beta blockers such as metoprolol/lopressor/Toprol XL or calcium channel blockers such as cardizem/Diltiazem or verapmil/Calan for 24 hours before the test.  Remove nitroglycerin patches and do not take nitrate preparations such as Imdur/isosorbide. No Persantine/Theophylline or Aggrenox meds should be used within 24 hours of the test. Discuss with MD what to do about diabetes meds if you take these.  When you arrive in the lab, the technician will inject a small amount of radioactive tracer. After a waiting period, resting pictures will be obtained.   You will be prepped for the stress portion of the test. With the stress (medical or treadmill), another small amount of radioactive tracer will be injected.  You will get a second set of pictures after a waiting period.   The whole test will take several hours.

## 2011-07-13 NOTE — Progress Notes (Addendum)
Patient Name: Diana Baker      SUBJECTIVE: feeling better  Past Medical History  Diagnosis Date  . Hypertension   . Pneumonia   . Asthma   . Arthritis     PHYSICAL EXAM Filed Vitals:   07/12/11 1240 07/12/11 1529 07/12/11 2113 07/13/11 0437  BP: 104/55 97/58 130/61 115/59  Pulse: 91 92 91 82  Temp: 98.1 F (36.7 C) 97.7 F (36.5 C) 97.7 F (36.5 C) 97.1 F (36.2 C)  TempSrc: Oral  Oral   Resp: 21 20 20 18   Height:  5\' 4"  (1.626 m)    Weight:  164 lb 14.5 oz (74.8 kg)  164 lb 14.5 oz (74.8 kg)  SpO2: 96% 95% 99% 97%    Well developed and nourished in no acute distress HENT normal Neck supple with JVP-flat Carotids brisk and full without bruits Clear Regular rate and rhythm, no murmurs or gallops Abd-soft with active BS without hepatomegaly No Clubbing cyanosis edema Skin-warm and dry A & Oriented  Grossly normal sensory and motor function   TELEMETRY: Reviewed telemetry pt in sinus NO further significant ectopy and no vt:    Intake/Output Summary (Last 24 hours) at 07/13/11 1034 Last data filed at 07/13/11 1000  Gross per 24 hour  Intake    600 ml  Output   1251 ml  Net   -651 ml    LABS: Basic Metabolic Panel:  Lab 07/13/11 9562 07/12/11 0550 07/11/11 0455 07/10/11 0530 07/09/11 0500 07/08/11 0545 07/07/11 2156  NA 134* 136 136 137 136 140 136  K 5.0 5.0 3.8 5.1 3.3* 3.7 4.1  CL 97 96 96 98 96 99 94*  CO2 24 27 28 30 31 31  32  GLUCOSE 101* 106* 96 88 94 94 123*  BUN 22 15 10 7 6  5* 5*  CREATININE 1.05 0.91 0.79 0.69 0.68 0.73 0.68  CALCIUM 9.6 9.7 -- -- -- -- --  MG -- -- -- 2.2 2.2 -- --  PHOS -- -- -- -- -- -- --   Cardiac Enzymes: No results found for this basename: CKTOTAL:3,CKMB:3,CKMBINDEX:3,TROPONINI:3 in the last 72 hours CBC:  Lab 07/13/11 0650 07/12/11 0550 07/11/11 0455 07/10/11 0530 07/09/11 1238 07/09/11 0500 07/08/11 0545 07/06/11 2047  WBC 11.1* 13.3* 11.0* 10.6* 9.5 9.6 10.7* --  NEUTROABS -- -- -- -- -- -- -- 11.4*  HGB  11.6* 11.9* 11.0* 10.3* 7.6* 7.3* 7.8* --  HCT 37.0 36.4 33.7* 32.2* 24.8* 23.3* 25.4* --  MCV 82.8 81.1 81.0 80.9 79.7 80.3 79.9 --  PLT 753* 848* 703* 652* 751* 672* 691* --   Echo : Study Conclusions  - Left ventricle: Abnormal septal motion The cavity size was normal. Wall thickness was normal. The estimated ejection fraction was 50%. - Mitral valve: Mild regurgitation. - Left atrium: The atrium was mildly dilated.      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

## 2011-07-20 ENCOUNTER — Ambulatory Visit (INDEPENDENT_AMBULATORY_CARE_PROVIDER_SITE_OTHER): Payer: Medicare Other | Admitting: *Deleted

## 2011-07-20 ENCOUNTER — Ambulatory Visit (HOSPITAL_COMMUNITY): Payer: Medicare Other | Attending: Cardiology | Admitting: Radiology

## 2011-07-20 VITALS — BP 133/60 | Ht 61.5 in | Wt 167.0 lb

## 2011-07-20 DIAGNOSIS — J45909 Unspecified asthma, uncomplicated: Secondary | ICD-10-CM | POA: Insufficient documentation

## 2011-07-20 DIAGNOSIS — E669 Obesity, unspecified: Secondary | ICD-10-CM | POA: Insufficient documentation

## 2011-07-20 DIAGNOSIS — I10 Essential (primary) hypertension: Secondary | ICD-10-CM | POA: Insufficient documentation

## 2011-07-20 DIAGNOSIS — R61 Generalized hyperhidrosis: Secondary | ICD-10-CM | POA: Insufficient documentation

## 2011-07-20 DIAGNOSIS — R079 Chest pain, unspecified: Secondary | ICD-10-CM

## 2011-07-20 DIAGNOSIS — R000 Tachycardia, unspecified: Secondary | ICD-10-CM | POA: Insufficient documentation

## 2011-07-20 DIAGNOSIS — R5381 Other malaise: Secondary | ICD-10-CM | POA: Insufficient documentation

## 2011-07-20 DIAGNOSIS — R002 Palpitations: Secondary | ICD-10-CM | POA: Insufficient documentation

## 2011-07-20 DIAGNOSIS — R55 Syncope and collapse: Secondary | ICD-10-CM | POA: Insufficient documentation

## 2011-07-20 DIAGNOSIS — R0602 Shortness of breath: Secondary | ICD-10-CM | POA: Insufficient documentation

## 2011-07-20 DIAGNOSIS — Z8249 Family history of ischemic heart disease and other diseases of the circulatory system: Secondary | ICD-10-CM | POA: Insufficient documentation

## 2011-07-20 DIAGNOSIS — R0989 Other specified symptoms and signs involving the circulatory and respiratory systems: Secondary | ICD-10-CM | POA: Insufficient documentation

## 2011-07-20 DIAGNOSIS — E876 Hypokalemia: Secondary | ICD-10-CM

## 2011-07-20 DIAGNOSIS — Z87891 Personal history of nicotine dependence: Secondary | ICD-10-CM | POA: Insufficient documentation

## 2011-07-20 DIAGNOSIS — R0789 Other chest pain: Secondary | ICD-10-CM | POA: Insufficient documentation

## 2011-07-20 DIAGNOSIS — R0609 Other forms of dyspnea: Secondary | ICD-10-CM | POA: Insufficient documentation

## 2011-07-20 LAB — BASIC METABOLIC PANEL
BUN: 20 mg/dL (ref 6–23)
Calcium: 9.3 mg/dL (ref 8.4–10.5)
Chloride: 105 mEq/L (ref 96–112)
Creatinine, Ser: 1.1 mg/dL (ref 0.4–1.2)
GFR: 65.26 mL/min (ref >60.00)

## 2011-07-20 MED ORDER — TECHNETIUM TC 99M TETROFOSMIN IV KIT
33.0000 | PACK | Freq: Once | INTRAVENOUS | Status: AC | PRN
  Administered 2011-07-20: 33 via INTRAVENOUS

## 2011-07-20 MED ORDER — TECHNETIUM TC 99M TETROFOSMIN IV KIT
11.0000 | PACK | Freq: Once | INTRAVENOUS | Status: AC | PRN
  Administered 2011-07-20: 11 via INTRAVENOUS

## 2011-07-20 MED ORDER — REGADENOSON 0.4 MG/5ML IV SOLN
0.4000 mg | Freq: Once | INTRAVENOUS | Status: AC
  Administered 2011-07-20: 0.4 mg via INTRAVENOUS

## 2011-07-20 NOTE — Progress Notes (Signed)
[facility] SITE 3 NUCLEAR MED [address] [place] [address] [phone]  Cardiology Nuclear Med Study  Diana Baker is a 66 y.o. female     MRN : [id number]     DOB: 1946-02-21  Procedure Date: 07/20/2011  Nuclear Med Background Indication for Stress Test:  Evaluation for Ischemia and 07/06/11 Post Hospital: PMVT c/w Torsades, (-) enzymes History:  Asthma and 3/13 Echo:EF=50% Cardiac Risk Factors: Family History - CAD, History of Smoking, Hypertension and Obesity  Symptoms:  Chest Tightness (last episode of chest discomfort was one week prior to hospital visit (07/06/11)), Diaphoresis, DOE/SOB, Fatigue, Palpitations, Rapid HR and Syncope   Nuclear Pre-Procedure Caffeine/Decaff Intake:  None NPO After: 7:00pm   Lungs:  clear O2 Sat: 97% on room air. IV 0.9% NS with Angio Cath:  20g  IV Site: R Antecubital  IV Started by:  Stanton Kidney, EMT-P  Chest Size (in):  38 Cup Size: B  Height: 5' 1.5" (1.562 m)  Weight:  167 lb (75.751 kg)  BMI:  Body mass index is 31.04 kg/(m^2). Tech Comments:  NA    Nuclear Med Study 1 or 2 day study: 1 day  Stress Test Type:  Lexiscan  Reading MD: Olga Millers, MD  Order Authorizing Provider:  Berton Mount, MD  Resting Radionuclide: Technetium 51m Tetrofosmin  Resting Radionuclide Dose: 11.0 mCi   Stress Radionuclide:  Technetium 43m Tetrofosmin  Stress Radionuclide Dose: 33.0 mCi           Stress Protocol Rest HR: 61 Stress HR: 104  Rest BP: 133/60 Stress BP: 142/44  Exercise Time (min): n/a METS: n/a   Predicted Max HR: 154 bpm % Max HR: 67.53 bpm Rate Pressure Product: 21308   Dose of Adenosine (mg):  n/a Dose of Lexiscan: 0.4 mg  Dose of Atropine (mg): n/a Dose of Dobutamine: n/a mcg/kg/min (at max HR)  Stress Test Technologist: Smiley Houseman, CMA-N  Nuclear Technologist:  Doyne Keel, CNMT     Rest Procedure:  Myocardial perfusion imaging was performed at rest 45 minutes following the intravenous administration  of Technetium 56m Tetrofosmin.  Rest ECG: No acute changes; early repolarization.  Stress Procedure:  The patient received IV Lexiscan 0.4 mg over 15-seconds.  Technetium 75m Tetrofosmin injected at 30-seconds.  There were no significant changes with Lexiscan.  Quantitative spect images were obtained after a 45 minute delay.  Stress ECG: No significant ST segment change suggestive of ischemia.  QPS Raw Data Images:  There is interference from nuclear activity from structures below the diaphragm. This does not affect the ability to read the study. Stress Images:  Normal homogeneous uptake in all areas of the myocardium. Rest Images:  Normal homogeneous uptake in all areas of the myocardium. Subtraction (SDS):  No evidence of ischemia. Transient Ischemic Dilatation (Normal <1.22):  1.09 Lung/Heart Ratio (Normal <0.45):  0.32  Quantitative Gated Spect Images QGS EDV:  87 ml QGS ESV:  31 ml  Impression Exercise Capacity:  Lexiscan with no exercise. BP Response:  Normal blood pressure response. Clinical Symptoms:  There is dyspnea. ECG Impression:  No significant ST segment change suggestive of ischemia. Comparison with Prior Nuclear Study: No previous nuclear study performed  Overall Impression:  Normal stress nuclear study.  LV Ejection Fraction: 64%.  LV Wall Motion:  NL LV Function; NL Wall Motion   Olga Millers

## 2011-08-02 ENCOUNTER — Encounter: Payer: Self-pay | Admitting: Physician Assistant

## 2011-08-02 ENCOUNTER — Ambulatory Visit (INDEPENDENT_AMBULATORY_CARE_PROVIDER_SITE_OTHER): Payer: Medicare Other | Admitting: Physician Assistant

## 2011-08-02 ENCOUNTER — Telehealth: Payer: Self-pay | Admitting: *Deleted

## 2011-08-02 VITALS — BP 158/78 | HR 75 | Ht 61.5 in | Wt 170.0 lb

## 2011-08-02 DIAGNOSIS — I10 Essential (primary) hypertension: Secondary | ICD-10-CM

## 2011-08-02 DIAGNOSIS — I472 Ventricular tachycardia: Secondary | ICD-10-CM

## 2011-08-02 DIAGNOSIS — E876 Hypokalemia: Secondary | ICD-10-CM

## 2011-08-02 DIAGNOSIS — D649 Anemia, unspecified: Secondary | ICD-10-CM

## 2011-08-02 DIAGNOSIS — R911 Solitary pulmonary nodule: Secondary | ICD-10-CM

## 2011-08-02 DIAGNOSIS — I509 Heart failure, unspecified: Secondary | ICD-10-CM

## 2011-08-02 LAB — CBC WITH DIFFERENTIAL/PLATELET
Basophils Absolute: 0 10*3/uL (ref 0.0–0.1)
Basophils Relative: 0.5 % (ref 0.0–3.0)
Eosinophils Relative: 5.4 % — ABNORMAL HIGH (ref 0.0–5.0)
HCT: 30.6 % — ABNORMAL LOW (ref 36.0–46.0)
Hemoglobin: 9.8 g/dL — ABNORMAL LOW (ref 12.0–15.0)
Lymphocytes Relative: 16.1 % (ref 12.0–46.0)
Lymphs Abs: 1.2 10*3/uL (ref 0.7–4.0)
Monocytes Relative: 3.8 % (ref 3.0–12.0)
Neutro Abs: 5.8 10*3/uL (ref 1.4–7.7)
RBC: 3.71 Mil/uL — ABNORMAL LOW (ref 3.87–5.11)
RDW: 17.3 % — ABNORMAL HIGH (ref 11.5–14.6)
WBC: 7.8 10*3/uL (ref 4.5–10.5)

## 2011-08-02 LAB — BASIC METABOLIC PANEL
Calcium: 8.8 mg/dL (ref 8.4–10.5)
GFR: 97.88 mL/min (ref >60.00)
Potassium: 3.7 mEq/L (ref 3.5–5.1)
Sodium: 139 mEq/L (ref 135–145)

## 2011-08-02 NOTE — Telephone Encounter (Signed)
Message copied by Tarri Fuller on Tue Aug 02, 2011  4:01 PM ------      Message from: [place], [place] T      Created: Tue Aug 02, 2011  2:24 PM       K+ and Magnesium, renal fxn good      She needs follow up with PCP for anemia        - fax labs to PCP        - make sure she schedules follow up      Tereso Newcomer, PA-C  2:24 PM 08/02/2011

## 2011-08-02 NOTE — Patient Instructions (Signed)
Your physician recommends that you schedule a follow-up appointment in: 2-3 MONTHS WITH DR. Graciela Husbands  Your physician recommends that you return for lab work in: TODAY BMET, CBC W/DIFF, MAG  NO MEDICATION CHANGES TODAY

## 2011-08-02 NOTE — Telephone Encounter (Signed)
Pt notified of lab results today and to make appt w/PCP Dr. Phillips Odor about anemia. I will fax results to PCP today. Pt gave me verbal understanding today. Danielle Rankin

## 2011-08-02 NOTE — Progress Notes (Signed)
[address] [place], [place]  [address] Phone: [phone] Fax:  [phone]  Date:  08/02/2011   Name:  Diana Baker       DOB:  1945/09/04 MRN:  [id number]  PCP:  Colette Ribas, MD, MD  Pulmonologist:  Dr. Juanetta Gosling Rheumatologist:  Dr. Hilda Lias Primary Cardiologist/Electrophysiologist:  Dr. Sherryl Manges     History of Present Illness: Diana Baker is a 66 y.o. female who presents for post hospital follow up.  She was admitted 3/27-4/3 polymorphic ventricular tachycardia in the setting of prolonged QT consistent with Torsades due to profound hypokalemia (K 2.0).  She had recently been ill and it was felt that her low potassium was related to continued use of Benicar/HCTZ.  Her potassium was replaced.  She ruled out for myocardial infarction.  Her QTc remained prolonged.   She did develop signs and symptoms of volume overload.  Echocardiogram 07/07/11: Abnormal septal motion, normal wall thickness, EF 50%, mild MR, mild LAE.  She did require IV diuresis for congestive heart failure.    She was noted to be anemic and developed worsening anemia which required transfusion with packed red blood cells.  Her iron level was mildly low.    TSH was somewhat elevated at 5.303 but her free T4 was normal.     It was noted that she had pulmonary nodules on a recent CT and pulmonology was asked to see the patient.  She was seen by Dr. Delford Field who felt that her nodules were inflammatory and related to her bronchiectasis/recent exacerbation.  Sputum culture was negative.  AFB cultures are still pending.  It was thought that she might ultimately need bronchoscopy but decision for this will be deferred to Dr. Juanetta Gosling (her pulmonologist in [place]).  It was also recommended she have a follow up CT scan in 3 months (per Dr. Juanetta Gosling).    Outpatient Myoview was to be obtained to assess for ischemia as a cause for her ventricular tachycardia.    Lexiscan Myoview 07/20/11: EF 64%,  no ischemia.   Labs at discharge: Hemoglobin 11.6 (nadir 7.6), potassium 5, creatinine 1.05, magnesium 2.2, ALT 7, BNP 3172, TSH 5.303.   Followup labs 07/20/11: Potassium 4.4, BUN 20, creatinine 1.1.  Overall she is feeling good.  She has a lot of joint pain today.  She denies significant dyspnea.  No orthopnea, PND or edema.  No chest pain.  No syncope or palpitations.    Past Medical History  Diagnosis Date  . Hypertension   . Asthma   . Bronchiectasis   . DJD (degenerative joint disease)   . 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 Diastolic CHF     in setting of anemia and PMVT 3/13 req. IV diuresis  . Normocytic anemia   . Pulmonary nodules   . Rheumatoid arthritis   . Hypokalemia 06/2011    admx with PMVT/Torsades de Pointes    Current Outpatient Prescriptions  Medication Sig Dispense Refill  . aspirin EC 81 MG EC tablet Take 1 tablet (81 mg total) by mouth daily.      . irbesartan (AVAPRO) 300 MG tablet Take 1 tablet (300 mg total) by mouth daily.  30 tablet  11  . naproxen sodium (ANAPROX) 220 MG tablet Take 440  mg by mouth as needed. FOR PAIN      . spironolactone (ALDACTONE) 25 MG tablet Take 1 tablet (25 mg total) by mouth daily.  30 tablet  11    Allergies: No Known Allergies  History  Substance Use Topics  . Smoking status: Never Smoker   . Smokeless tobacco: Not on file  . Alcohol Use: No     ROS:  Please see the history of present illness.   All other systems reviewed and negative.   PHYSICAL EXAM: VS:  BP 158/78  Pulse 75  Ht 5' 1.5" (1.562 m)  Wt 170 lb (77.111 kg)  BMI 31.60 kg/m2 Repeat BP by me: 150/70 on left  Well nourished, well developed, in no acute distress HEENT: normal Neck: no JVD Cardiac:  normal S1, S2; RRR; no murmur Lungs:  clear to  auscultation bilaterally, no wheezing, rhonchi or rales Abd: soft, nontender, no hepatomegaly Ext: no edema Skin: warm and dry Neuro:  CNs 2-12 intact, no focal abnormalities noted  EKG:  NSR, HR 75, normal axis, no ischemic changes, QTc 469 ms  ASSESSMENT AND PLAN:  1.  Polymorphic VT in setting of prolonged QT c/w Torsades due to profound hypokalemia QT is now normal.  Recent K ok.  She is feeling well without dyspnea or near syncope.  Myoview was low risk.  Suspect arrhythmia all related to low K in setting of acute illness and thiazide diuretic.  Will need to avoid K wasting diuretics.  Repeat bmet and magnesium today.  Follow up with Dr. Sherryl Manges in 2-3 mos.  2.  Anemia ? Related to chronic disease and poor po intake with recent illness.  Check cbc today.  She needs follow up with PCP.  She will make an appt.  3.  Pulmonary Nodules She will follow up with Dr. Juanetta Gosling.  As noted in the hospital, she may require bronchoscopy, but this is up to Dr. Juanetta Gosling.  Follow up CT in 3 mos was also recommended and this can be done through Dr. Juanetta Gosling as well.   4.  Hypertension She is in a lot of pain today.  Continue to monitor BP for now.  5.  Congestive Heart Failure with preserved LV function I suspect this was related to anemia in the setting of her ventricular arrhythmia.  No signs of symptoms of volume overload today.   Luna Glasgow, PA-C  8:24 AM 08/02/2011

## 2011-08-03 ENCOUNTER — Encounter: Payer: Self-pay | Admitting: Physician Assistant

## 2011-08-22 LAB — AFB CULTURE WITH SMEAR (NOT AT ARMC)

## 2011-10-19 ENCOUNTER — Other Ambulatory Visit (HOSPITAL_COMMUNITY): Payer: Self-pay | Admitting: Pulmonary Disease

## 2011-10-19 ENCOUNTER — Ambulatory Visit (INDEPENDENT_AMBULATORY_CARE_PROVIDER_SITE_OTHER): Payer: Medicare Other | Admitting: Internal Medicine

## 2011-10-19 ENCOUNTER — Encounter: Payer: Self-pay | Admitting: Internal Medicine

## 2011-10-19 VITALS — BP 128/68 | HR 61 | Ht 61.5 in | Wt 173.0 lb

## 2011-10-19 DIAGNOSIS — R9431 Abnormal electrocardiogram [ECG] [EKG]: Secondary | ICD-10-CM

## 2011-10-19 DIAGNOSIS — I472 Ventricular tachycardia, unspecified: Secondary | ICD-10-CM

## 2011-10-19 DIAGNOSIS — R911 Solitary pulmonary nodule: Secondary | ICD-10-CM

## 2011-10-19 LAB — BASIC METABOLIC PANEL
BUN: 17 mg/dL (ref 6–23)
CO2: 25 mEq/L (ref 19–32)
Chloride: 106 mEq/L (ref 96–112)
Creatinine, Ser: 0.9 mg/dL (ref 0.4–1.2)
Potassium: 4.1 mEq/L (ref 3.5–5.1)

## 2011-10-19 LAB — MAGNESIUM: Magnesium: 2 mg/dL (ref 1.5–2.5)

## 2011-10-19 NOTE — Assessment & Plan Note (Signed)
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 We spent more than 50% of our 30 minute visit today discussing the physiology and the significance of drug and electrolyte abnormalities and the relationship to QT prolongation. We also discussed the importance of having her check all of her drugs on the QT drugs.org website.  We'll check a potassium and magnesium levels today.  We'll also look into the role genetic testing for acquired QT prolongation

## 2011-10-19 NOTE — Patient Instructions (Signed)
Please check all your drugs against the "QTdrugs.org" website.  Your physician recommends that you have lab work today: bmp/magensium   Please let us know if you would like to continue follow up with Dr. Graciela Husbands or just see your PCP.

## 2011-10-19 NOTE — Progress Notes (Signed)
  HPI  Diana Baker is a 66 y.o. female Seen in followup for TdP hospitalizaiton 2/2 hypokalemia  Has no prior history of ectopy and no family history.  Past Medical History  Diagnosis Date  . Hypertension   . Asthma   . Bronchiectasis   . DJD (degenerative joint disease)   . 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 Diastolic CHF     in setting of anemia and PMVT 3/13 req. IV diuresis  . Normocytic anemia     req txn with PRBCs during 3/13 admission; Fe 37, TIBC 185, Ferritin 275, RBC folate 506, B12 719 (06/2011)  . Pulmonary nodules   . Rheumatoid arthritis   . Hypokalemia 06/2011    admx with PMVT/Torsades de Pointes    No past surgical history on file.  Current Outpatient Prescriptions  Medication Sig Dispense Refill  . aspirin EC 81 MG EC tablet Take 1 tablet (81 mg total) by mouth daily.      . ferrous sulfate 325 (65 FE) MG EC tablet Take 325 mg by mouth 2 (two) times daily.      . folic acid (FOLVITE) 1 MG tablet Take 1 mg by mouth daily.      . irbesartan (AVAPRO) 300 MG tablet Take 1 tablet (300 mg total) by mouth daily.  30 tablet  11  . methotrexate (RHEUMATREX) 2.5 MG tablet as directed.      . naproxen sodium (ANAPROX) 220 MG tablet Take 440 mg by mouth as needed. FOR PAIN      . spironolactone (ALDACTONE) 25 MG tablet Take 1 tablet (25 mg total) by mouth daily.  30 tablet  11    No Known Allergies  Review of Systems negative except from HPI and PMH  Physical Exam BP 128/68  Pulse 61  Ht 5' 1.5" (1.562 m)  Wt 173 lb (78.472 kg)  BMI 32.16 kg/m2  SpO2 99% Well developed and well nourished in no acute distress HENT normal E scleral and icterus clear Neck Supple JVP flat; carotids brisk and full Clear to ausculation Regular rate and rhythm, no murmurs gallops or  rub Soft with active bowel sounds No clubbing cyanosis none Edema Alert and oriented, grossly normal motor and sensory function Skin Warm and Dry    Assessment and  Plan

## 2011-10-21 ENCOUNTER — Ambulatory Visit (HOSPITAL_COMMUNITY)
Admission: RE | Admit: 2011-10-21 | Discharge: 2011-10-21 | Disposition: A | Discharge status: Home / Self Care | Payer: Medicare Other | Source: Ambulatory Visit | Attending: Pulmonary Disease | Admitting: Pulmonary Disease

## 2011-10-21 DIAGNOSIS — R911 Solitary pulmonary nodule: Secondary | ICD-10-CM | POA: Insufficient documentation

## 2012-04-24 ENCOUNTER — Ambulatory Visit (INDEPENDENT_AMBULATORY_CARE_PROVIDER_SITE_OTHER): Payer: Medicare Other | Admitting: Internal Medicine

## 2012-04-24 ENCOUNTER — Encounter: Payer: Self-pay | Admitting: Internal Medicine

## 2012-04-24 VITALS — BP 161/74 | HR 74 | Wt 201.0 lb

## 2012-04-24 DIAGNOSIS — I4581 Long QT syndrome: Secondary | ICD-10-CM

## 2012-04-24 DIAGNOSIS — R9431 Abnormal electrocardiogram [ECG] [EKG]: Secondary | ICD-10-CM

## 2012-04-24 LAB — BASIC METABOLIC PANEL
CO2: 26 mEq/L (ref 19–32)
Chloride: 109 mEq/L (ref 96–112)
Creatinine, Ser: 0.9 mg/dL (ref 0.4–1.2)
Glucose, Bld: 91 mg/dL (ref 70–99)

## 2012-04-24 NOTE — Assessment & Plan Note (Signed)
Borderline abnormal. We'll check potassium and magnesium. The other issue that remains unresolved is whether genetic testing is appropriate in drug-induced QT prolongation. We will follow these recommendations as they evolve over the next time

## 2012-04-24 NOTE — Patient Instructions (Signed)
Your physician recommends that you have lab work today: bmp/magnesium  Your physician wants you to follow-up in: 1 year with Dr. Graciela Husbands. You will receive a reminder letter in the mail two months in advance. If you don't receive a letter, please call our office to schedule the follow-up appointment.  Your physician recommends that you continue on your current medications as directed. Please refer to the Current Medication list given to you today.

## 2012-04-24 NOTE — Progress Notes (Signed)
Patient Care Team: Corrie Mckusick, MD as PCP - General (Family Medicine)   HPI  Diana Baker is a 67 y.o. female Seen in followup for TdP hospitalizaiton 2/2 hypokalemia  Has no prior history of ectopy and no family history.   The patient denies chest pain, shortness of breath, nocturnal dyspnea, orthopnea or peripheral edema.  There have been no palpitations, lightheadedness or syncope.   All drugs have been tested against daily drugs.org website  Past Medical History  Diagnosis Date  . Hypertension   . Asthma   . Bronchiectasis   . DJD (degenerative joint disease)   . 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 Diastolic CHF     in setting of anemia and PMVT 3/13 req. IV diuresis  . Normocytic anemia     req txn with PRBCs during 3/13 admission; Fe 37, TIBC 185, Ferritin 275, RBC folate 506, B12 719 (06/2011)  . Pulmonary nodules   . Rheumatoid arthritis   . Hypokalemia 06/2011    admx with PMVT/Torsades de Pointes    No past surgical history on file.  Current Outpatient Prescriptions  Medication Sig Dispense Refill  . aspirin EC 81 MG EC tablet Take 1 tablet (81 mg total) by mouth daily.      . ferrous sulfate 325 (65 FE) MG EC tablet Take 325 mg by mouth 2 (two) times daily.      . folic acid (FOLVITE) 1 MG tablet Take 1 mg by mouth daily.      . irbesartan (AVAPRO) 300 MG tablet Take 1 tablet (300 mg total) by mouth daily.  30 tablet  11  . methotrexate (RHEUMATREX) 2.5 MG tablet as directed.      . naproxen sodium (ANAPROX) 220 MG tablet Take 440 mg by mouth as needed. FOR PAIN      . spironolactone (ALDACTONE) 25 MG tablet Take 1 tablet (25 mg total) by mouth daily.  30 tablet  11    No Known Allergies  Review of Systems negative except from HPI and PMH  Physical Exam BP 161/74   Pulse 74  Wt 201 lb (91.173 kg) Well developed and nourished in no acute distress HENT normal Neck supple with JVP-flat Clear Regular rate and rhythm, no murmurs or gallops Abd-soft with active BS No Clubbing cyanosis edema Skin-warm and dry A & Oriented  Grossly normal sensory and motor function  Electrocardiogram today demonstrates sinus rhythm at 74 Intervals 14/10/42 [id number]  Axis is 65  Assessment and  Plan

## 2012-05-11 ENCOUNTER — Other Ambulatory Visit (HOSPITAL_COMMUNITY): Payer: Self-pay | Admitting: Pulmonary Disease

## 2012-05-11 DIAGNOSIS — R911 Solitary pulmonary nodule: Secondary | ICD-10-CM

## 2012-05-15 ENCOUNTER — Ambulatory Visit (HOSPITAL_COMMUNITY)
Admission: RE | Admit: 2012-05-15 | Discharge: 2012-05-15 | Disposition: A | Discharge status: Home / Self Care | Payer: Medicare Other | Source: Ambulatory Visit | Attending: Pulmonary Disease | Admitting: Pulmonary Disease

## 2012-05-15 DIAGNOSIS — R911 Solitary pulmonary nodule: Secondary | ICD-10-CM | POA: Insufficient documentation

## 2012-07-06 ENCOUNTER — Other Ambulatory Visit (HOSPITAL_COMMUNITY): Payer: Self-pay | Admitting: Physician Assistant

## 2012-07-17 ENCOUNTER — Other Ambulatory Visit (HOSPITAL_COMMUNITY): Payer: Self-pay | Admitting: Family Medicine

## 2012-07-17 DIAGNOSIS — Z139 Encounter for screening, unspecified: Secondary | ICD-10-CM

## 2012-07-19 ENCOUNTER — Ambulatory Visit (HOSPITAL_COMMUNITY)
Admission: RE | Admit: 2012-07-19 | Discharge: 2012-07-19 | Disposition: A | Discharge status: Home / Self Care | Payer: Medicare Other | Source: Ambulatory Visit | Attending: Family Medicine | Admitting: Family Medicine

## 2012-07-19 DIAGNOSIS — Z1231 Encounter for screening mammogram for malignant neoplasm of breast: Secondary | ICD-10-CM | POA: Insufficient documentation

## 2012-07-19 DIAGNOSIS — Z139 Encounter for screening, unspecified: Secondary | ICD-10-CM

## 2013-03-30 IMAGING — CT CT CHEST W/O CM
2 of 3 series · 15 of 36 positions shown, 18 images · non-contrast
Comparison: Chest radiograph 07/06/2011.  CT chest 04/25/2011,
08/03/2009 and 02/04/2009.

CLINICAL DATA: Abnormal chest radiograph.

CT CHEST WITHOUT CONTRAST
TECHNIQUE: Multidetector CT imaging of the chest was performed
following the standard protocol without IV contrast.

[Series 2: chestroutine 5.0 b40f · axial · 0.66mm/px · z∈[+865,+1060]mm · 12 of 47 slices shown, 15 images]
[im 4/47  mediastinal]
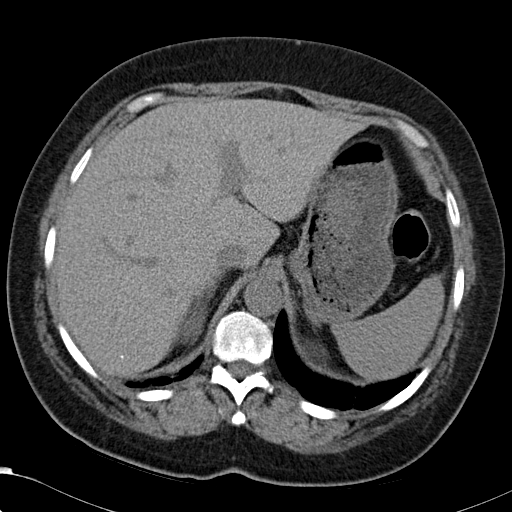
[im 4/47  lung]
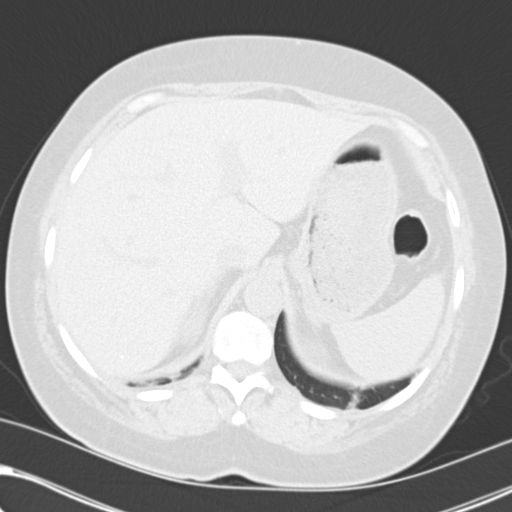
[im 7/47  lung]
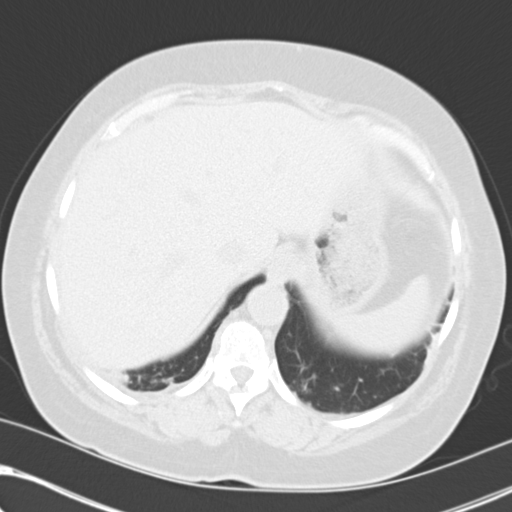
[im 11/47  lung]
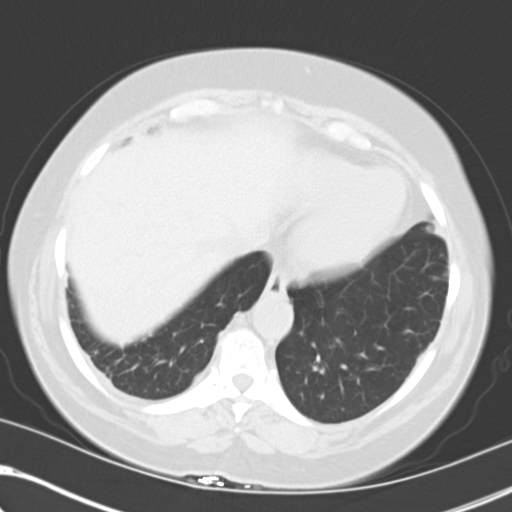
[im 14/47  lung]
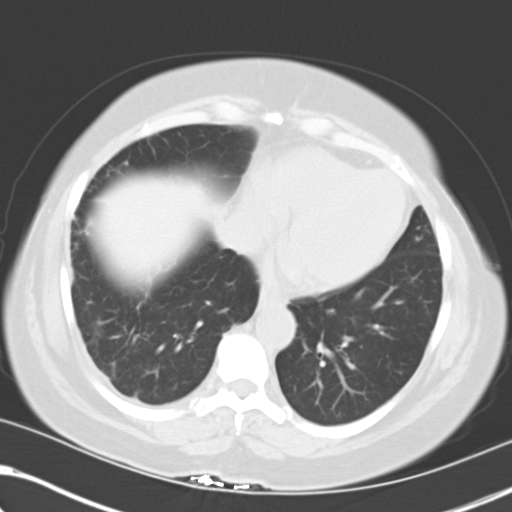
[im 18/47  mediastinal]
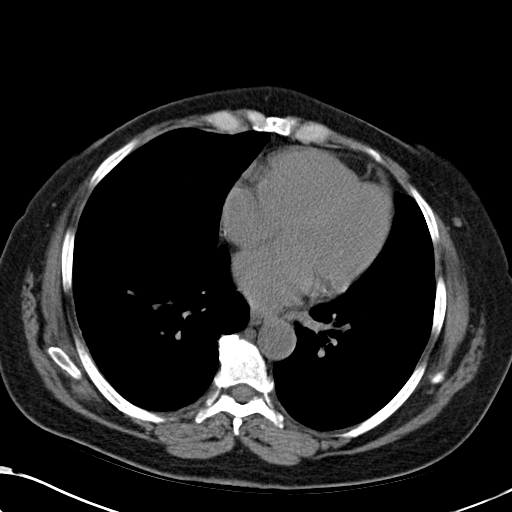
[im 18/47  lung]
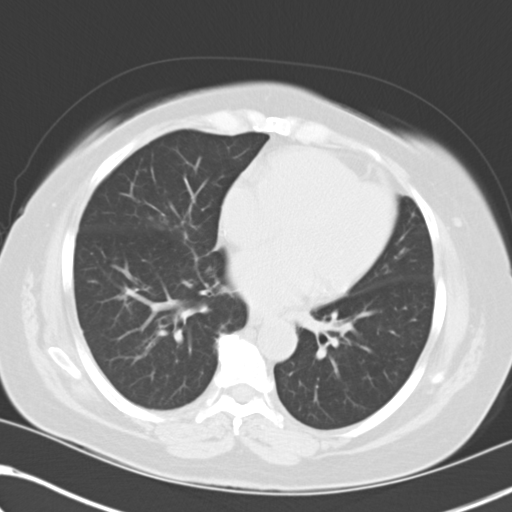
[im 21/47  lung]
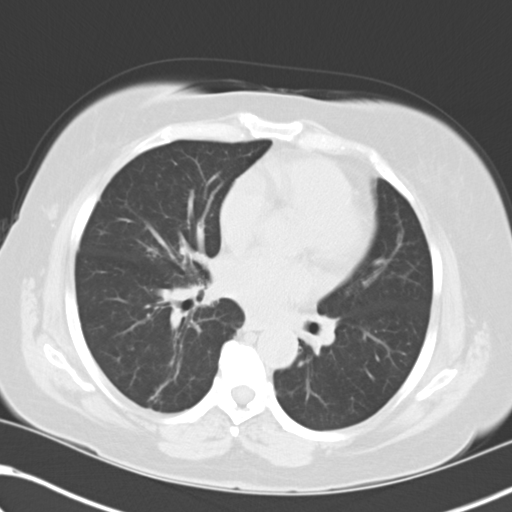
[im 26/47  lung]
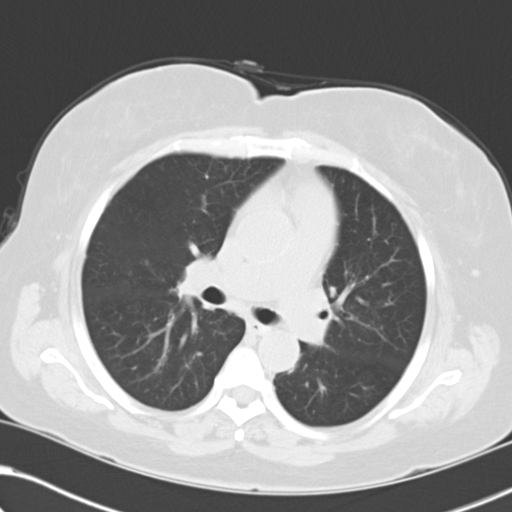
[im 29/47  lung]
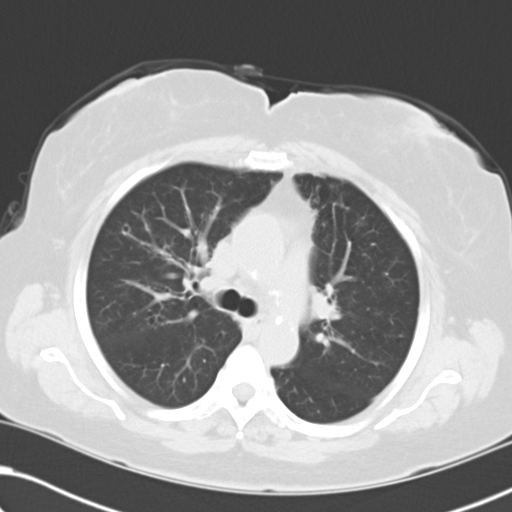
[im 33/47  mediastinal]
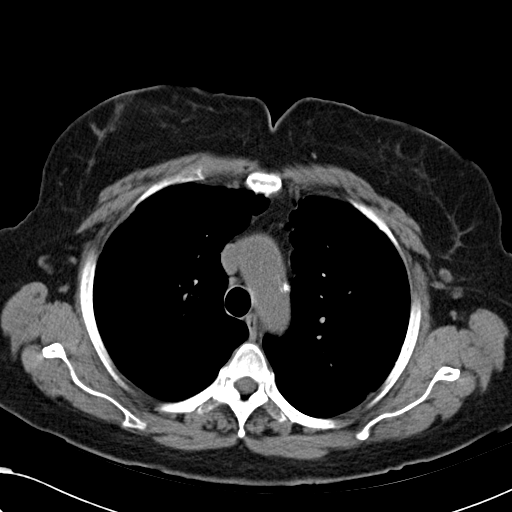
[im 33/47  lung]
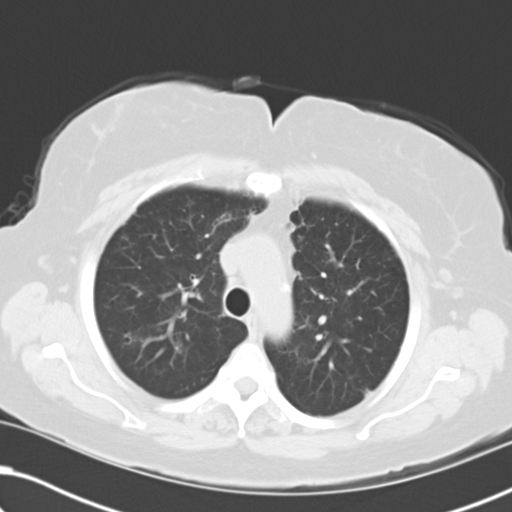
[im 36/47  lung]
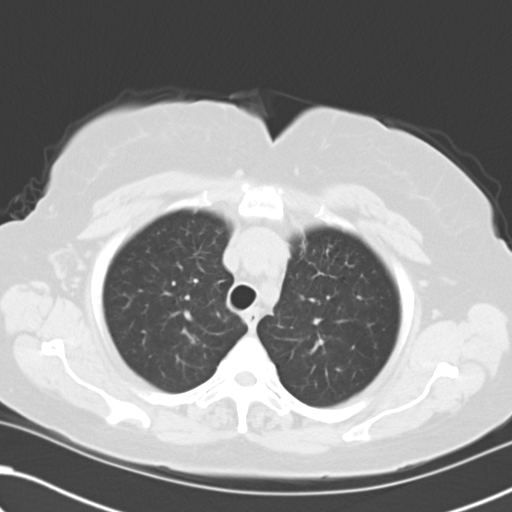
[im 40/47  lung]
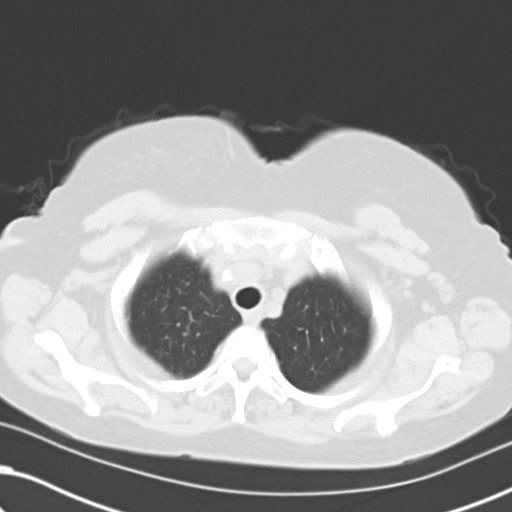
[im 43/47  lung]
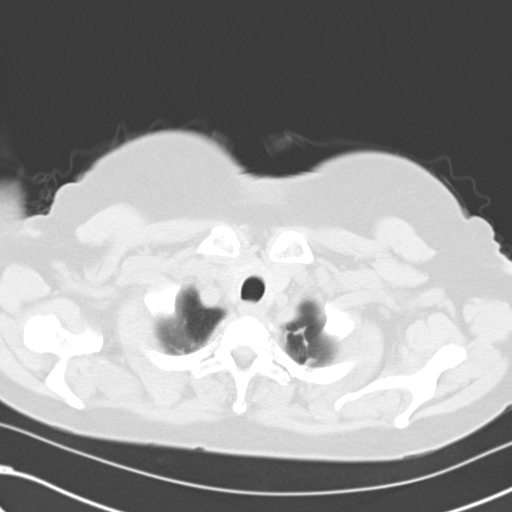

[Series 4: mpr coro 3mm · coronal · 0.52mm/px · 3 of 67 slices shown]
[im 14/67  lung]
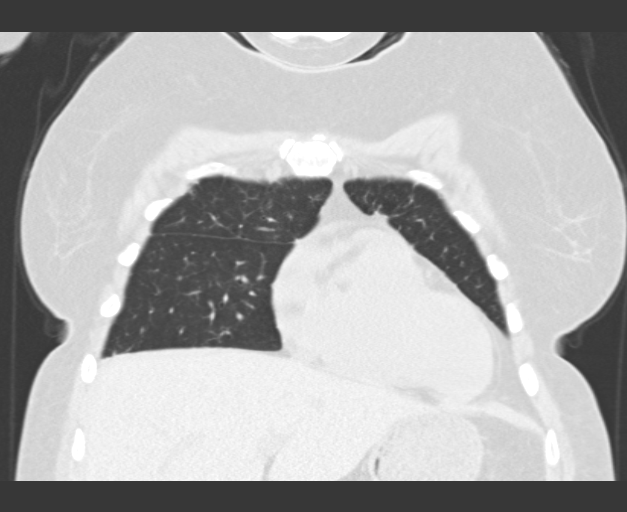
[im 27/67  lung]
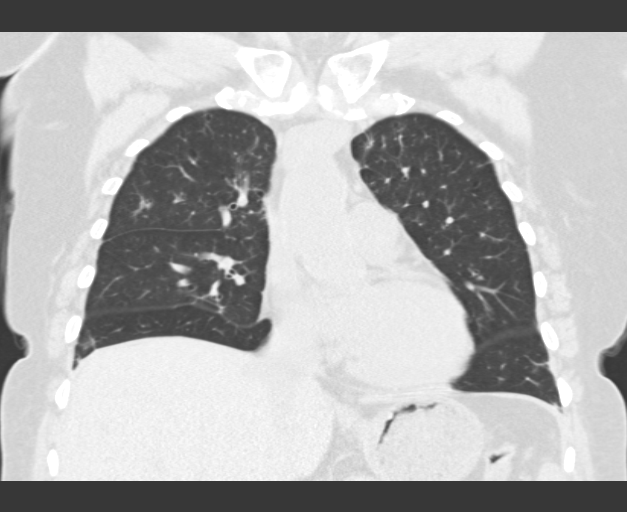
[im 40/67  lung]
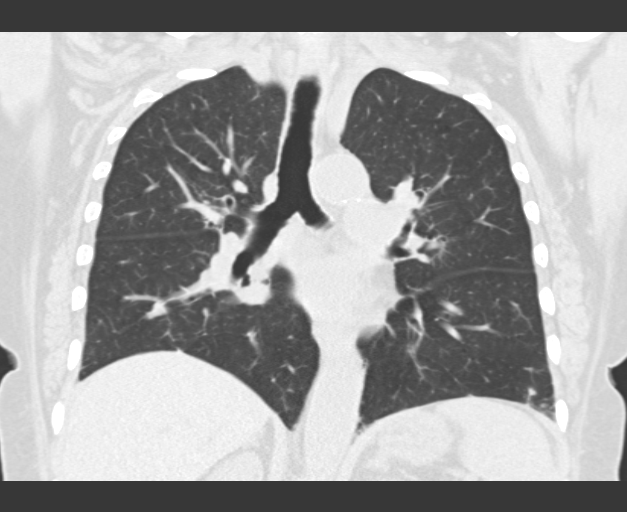

[15 of 36 positions shown; findings below may reference images not displayed]

FINDINGS: No pathologically enlarged mediastinal or axillary lymph
nodes.  Hilar regions are difficult to definitively evaluate
without IV contrast.  Atherosclerotic calcification of the arterial
vasculature, including coronary arteries.  Heart size normal.  No
pericardial effusion.

Minimal biapical scarring.  There has been some interval clearing
of areas of peribronchovascular ground-glass, nodularity and
airspace consolidation in the lungs bilaterally.  Residual
bronchiectasis, mild architectural distortion and scattered
nodularity.  Largest nodule is seen in the inferior lingula,
measuring 7 mm (image 36), not definitely seen on the prior study.
No pleural fluid.  Airway is otherwise unremarkable.

Incidental imaging of the upper abdomen shows no acute findings.
No worrisome lytic or sclerotic lesions.
IMPRESSION: 1.  Marked interval improvement in diffuse bilateral
peribronchovascular ground-glass, nodularity airspace
consolidation.  Residual/underlying bronchiectasis, mild
architectural distortion and scattered nodularity, as discussed
above, indicative of post inflammatory scarring/fibrosis.
2.  New 7 mm inferior lingular nodule.  Follow-up is recommended in
3-6 months to ensure stability or resolution. This recommendation
follows the consensus statement: Guidelines for Management of Small
Pulmonary Nodules Detected on CT Scans:  A Statement from the

## 2013-04-30 ENCOUNTER — Encounter: Payer: Self-pay | Admitting: Internal Medicine

## 2013-04-30 ENCOUNTER — Ambulatory Visit (INDEPENDENT_AMBULATORY_CARE_PROVIDER_SITE_OTHER): Payer: Medicare Other | Admitting: Internal Medicine

## 2013-04-30 VITALS — BP 179/86 | HR 71 | Ht 62.0 in | Wt 235.0 lb

## 2013-04-30 DIAGNOSIS — R9431 Abnormal electrocardiogram [ECG] [EKG]: Secondary | ICD-10-CM

## 2013-04-30 DIAGNOSIS — R609 Edema, unspecified: Secondary | ICD-10-CM

## 2013-04-30 DIAGNOSIS — I10 Essential (primary) hypertension: Secondary | ICD-10-CM | POA: Insufficient documentation

## 2013-04-30 LAB — BASIC METABOLIC PANEL
BUN: 15 mg/dL (ref 6–23)
CHLORIDE: 105 meq/L (ref 96–112)
CO2: 28 mEq/L (ref 19–32)
CREATININE: 1 mg/dL (ref 0.4–1.2)
Calcium: 9.6 mg/dL (ref 8.4–10.5)
GFR: 68.56 mL/min (ref >60.00)
Glucose, Bld: 85 mg/dL (ref 70–99)
Potassium: 4 mEq/L (ref 3.5–5.1)
Sodium: 140 mEq/L (ref 135–145)

## 2013-04-30 MED ORDER — SPIRONOLACTONE 25 MG PO TABS: 25.0000 mg | ORAL_TABLET | Freq: Two times a day (BID) | ORAL | Status: DC

## 2013-04-30 NOTE — Assessment & Plan Note (Signed)
No intercurrent known tachycardia

## 2013-04-30 NOTE — Assessment & Plan Note (Signed)
Poorly controlled hypertension. We will increase her Aldactone as noted above. We will try to decrease her sodium intake. In the event that we cannot make headway in her blood pressure, we'll add a calcium blocker at her blood pressure check in 2 weeks time

## 2013-04-30 NOTE — Progress Notes (Signed)
      Patient Care Team: Halford Chessman, MD as PCP - General (Family Medicine)   HPI  Diana Baker is a 68 y.o. female Seen in followup for TdP hospitalizaiton 2/2 hypokalemia  Has no prior history of ectopy and no family history.  The patient denies chest pain, shortness of breath, She has had some peripheral edema. Her diet is salt replete.   or peripheral edema. There have been no palpitations, lightheadedness or syncope.  All drugs have been tested against daily drugs.org website    Past Medical History  Diagnosis Date  . Hypertension   . Asthma   . Bronchiectasis   . DJD (degenerative joint disease)   . 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 Diastolic CHF     in setting of anemia and PMVT 3/13 req. IV diuresis  . Normocytic anemia     req txn with PRBCs during 3/13 admission; Fe 37, TIBC 185, Ferritin 275, RBC folate 506, B12 719 (06/2011)  . Pulmonary nodules   . Rheumatoid arthritis(714.0)   . Hypokalemia 06/2011    admx with PMVT/Torsades de Pointes    No past surgical history on file.  Current Outpatient Prescriptions  Medication Sig Dispense Refill  . ferrous sulfate 325 (65 FE) MG EC tablet Take 325 mg by mouth 2 (two) times daily.      . folic acid (FOLVITE) 1 MG tablet Take 1 mg by mouth daily.      . hydroxychloroquine (PLAQUENIL) 200 MG tablet 200 mg daily.      . irbesartan (AVAPRO) 300 MG tablet take 1 tablet by mouth once daily  30 tablet  9  . methotrexate (RHEUMATREX) 2.5 MG tablet as directed.      . naproxen sodium (ANAPROX) 220 MG tablet Take 440 mg by mouth as needed. FOR PAIN      . spironolactone (ALDACTONE) 25 MG tablet take 1 tablet by mouth once daily  30 tablet  9   No current facility-administered medications for this visit.    No Known  Allergies  Review of Systems negative except from HPI and PMH  Physical Exam BP 179/86  Pulse 71  Ht 5\' 2"  (1.575 m)  Wt 235 lb (106.595 kg)  BMI 42.97 kg/m2 Well developed and well nourished in no acute distress HENT normal E scleral and icterus clear Neck Supple JVP flat; carotids brisk and full Clear to ausculation  Regular rate and rhythm, no murmurs gallops or rub Soft with active bowel sounds No clubbing cyanosis tr Edema Alert and oriented, grossly normal motor and sensory function Skin Warm and Dry  ecg NSR 71   13/09/43  Assessment and  Plan

## 2013-04-30 NOTE — Assessment & Plan Note (Signed)
In the patient has peripheral edema in the context of hypertension. Because she has had hypokalemia associated QT prolongation I am reluctant to use a loop diuretic unless pressed. Hence, we'll increase his her Aldactone from 25--50 mg a day once we clarified her potassium levels are okay. We will check her potassium levels 2 weeks afterwards.  Furthermore I have encouraged her to try to use a salt substitute. In the event that she is able to use more than 50% salt substitute will need followup of her potassium as well.

## 2013-04-30 NOTE — Patient Instructions (Signed)
Your physician recommends that you return for lab work today: BMET  Your physician recommends that you schedule a follow-up appointment in: 2 weeks in [place] for BP check & BMET lab draw  Your physician has recommended you make the following change in your medication:  1) Increase you Spironolactone to 25 mg twice daily  Your physician wants you to follow-up in: 6 months with PA in [place].  You will receive a reminder letter in the mail two months in advance. If you don't receive a letter, please call our office to schedule the follow-up appointment.  Your physician wants you to follow-up in: 1 year with Dr. Caryl Comes.  You will receive a reminder letter in the mail two months in advance. If you don't receive a letter, please call our office to schedule the follow-up appointment.   Try using a salt substitute

## 2013-04-30 NOTE — Assessment & Plan Note (Signed)
Await recommendations related to genetic testing for drug assoc QY prolongation

## 2013-05-01 ENCOUNTER — Other Ambulatory Visit: Payer: Self-pay

## 2013-05-01 MED ORDER — IRBESARTAN 300 MG PO TABS: ORAL_TABLET | ORAL | Status: DC

## 2013-05-14 ENCOUNTER — Ambulatory Visit (INDEPENDENT_AMBULATORY_CARE_PROVIDER_SITE_OTHER): Payer: Medicare Other

## 2013-05-14 VITALS — BP 136/64 | HR 79 | Ht 62.0 in | Wt 233.0 lb

## 2013-05-14 DIAGNOSIS — I10 Essential (primary) hypertension: Secondary | ICD-10-CM

## 2013-05-14 NOTE — Patient Instructions (Signed)
Continue taking medication as directed unless we call you back with different instructions  Plan on lab work today today Artist)   Thanks for [place] !

## 2013-05-15 ENCOUNTER — Telehealth: Payer: Self-pay | Admitting: Adult Health

## 2013-05-15 LAB — BASIC METABOLIC PANEL
BUN: 20 mg/dL (ref 6–23)
CO2: 26 mEq/L (ref 19–32)
Calcium: 9.2 mg/dL (ref 8.4–10.5)
Chloride: 102 mEq/L (ref 96–112)
Creat: 1.11 mg/dL — ABNORMAL HIGH (ref 0.50–1.10)
GLUCOSE: 93 mg/dL (ref 70–99)
POTASSIUM: 4.1 meq/L (ref 3.5–5.3)
SODIUM: 138 meq/L (ref 135–145)

## 2013-05-15 NOTE — Telephone Encounter (Signed)
[id number] nurse placed addendum in pt chart per request to change weight in nurse visit, left vm to advise pt her change has been made and to contact our office with any further concerns

## 2013-05-15 NOTE — Telephone Encounter (Signed)
Patient states that when she came in for her nurse visit yesterday it was documented that she weighed 223 lbs.  States that it should have been 233 lbs. / tgs

## 2013-05-16 NOTE — Progress Notes (Signed)
FYI to Dr.Klein regarding BP check

## 2013-11-26 ENCOUNTER — Other Ambulatory Visit: Payer: Self-pay | Admitting: Internal Medicine

## 2013-11-26 ENCOUNTER — Ambulatory Visit (HOSPITAL_COMMUNITY)
Admission: RE | Admit: 2013-11-26 | Discharge: 2013-11-26 | Disposition: A | Discharge status: Home / Self Care | Payer: Medicare Other | Source: Ambulatory Visit | Attending: Family Medicine | Admitting: Family Medicine

## 2013-11-26 DIAGNOSIS — M069 Rheumatoid arthritis, unspecified: Secondary | ICD-10-CM | POA: Diagnosis present

## 2013-11-26 DIAGNOSIS — IMO0001 Reserved for inherently not codable concepts without codable children: Secondary | ICD-10-CM | POA: Diagnosis present

## 2013-11-26 NOTE — Evaluation (Signed)
Physical Therapy Evaluation  Patient Details  Name: Diana Baker MRN: [id number] Date of Birth: 1945/11/24  Today's Date: 11/26/2013 Time: 1145-1205 PT Time Calculation (min): 20 min              Visit#: 1 of 1   Past Medical History:  Past Medical History  Diagnosis Date  . Hypertension   . Asthma   . Bronchiectasis   . DJD (degenerative joint disease)   . 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 Diastolic CHF     in setting of anemia and PMVT 3/13 req. IV diuresis  . Normocytic anemia     req txn with PRBCs during 3/13 admission; Fe 37, TIBC 185, Ferritin 275, RBC folate 506, B12 719 (06/2011)  . Pulmonary nodules   . Rheumatoid arthritis(714.0)   . Hypokalemia 06/2011    admx with PMVT/Torsades de Pointes   Past Surgical History: No past surgical history on file.  Subjective  Pt states that she noted that an area below her abdominal fold began to bleed on Saturday; 11/23/2013.  The bleeding would not stop therefore she went to the ER.  She was given Mupirocin which has seemed to have done very well.     Assessment  The patient wounds are healed.  She has three areas where the skin has not regained it's pigment which measure 1.7x .5 cm; .5 cm diameter and .3 cm.  The are is moist due to the abdominal fold.  Diana Baker states that she does not use any talc or baby powder on a regular basis when questioned.  She also states that she has moisture in this area on a constant basis. Pt was encouraged to lift area up her fold and wash with soap and water, dry and apply talc on a daily basis.    Physical Therapy Assessment and Plan PT Assessment and Plan Clinical Impression Statement: Pt wound has healed pt no longer needs therapy PT Plan: Discharge pt     Goals Home Exercise Program PT Goal:  Perform Home Exercise Program - Progress:  (education to prevent further break down )  Problem List Patient Active Problem List   Diagnosis Date Noted  . Edema, peripheral 04/30/2013  . Hypertension 04/30/2013  . Hemoptysis 07/09/2011  . Hypokalemia 07/09/2011  . Hypomagnesemia 07/09/2011  . Rheumatoid arthritis 07/09/2011  . Prolonged QT interval 07/09/2011  . Bronchiectasis with acute exacerbation 07/08/2011  . Polymorphic ventricular tachycardia 07/07/2011  . Pulmonary nodule 07/07/2011  . Anemia 07/07/2011  . Thrombocytosis 07/07/2011       GP Functional Assessment Tool Used: clincial judgement Functional Limitation: Other PT primary Other PT Primary Current Status (Q6578): At least 1 percent but less than 20 percent impaired, limited or restricted Other PT Primary Goal Status (I6962): At least 1 percent but less than 20 percent impaired, limited or restricted Other PT Primary Discharge Status [phone]): At least 1 percent but less than 20 percent impaired, limited or restricted  Diana Baker,Diana Baker 11/26/2013, 4:44 PM  Physician Documentation Your signature is required to indicate approval of the treatment plan as stated above.  Please sign and either send electronically or make a copy of this report for your files and return this physician signed original.   Please mark one 1.__approve of plan  2. ___approve of plan with the following conditions.   ______________________________                                                          _____________________ Physician Signature                                                                                                             Date  

## 2013-12-18 ENCOUNTER — Other Ambulatory Visit (HOSPITAL_COMMUNITY): Payer: Self-pay | Admitting: Family Medicine

## 2013-12-18 DIAGNOSIS — Z1231 Encounter for screening mammogram for malignant neoplasm of breast: Secondary | ICD-10-CM

## 2013-12-20 ENCOUNTER — Ambulatory Visit (HOSPITAL_COMMUNITY)
Admission: RE | Admit: 2013-12-20 | Discharge: 2013-12-20 | Disposition: A | Discharge status: Home / Self Care | Payer: Medicare Other | Source: Ambulatory Visit | Attending: Family Medicine | Admitting: Family Medicine

## 2013-12-20 DIAGNOSIS — Z1231 Encounter for screening mammogram for malignant neoplasm of breast: Secondary | ICD-10-CM | POA: Diagnosis not present

## 2013-12-29 ENCOUNTER — Other Ambulatory Visit: Payer: Self-pay | Admitting: Internal Medicine

## 2014-01-31 ENCOUNTER — Other Ambulatory Visit: Payer: Self-pay | Admitting: Internal Medicine

## 2014-02-26 ENCOUNTER — Other Ambulatory Visit: Payer: Self-pay | Admitting: Internal Medicine

## 2014-04-25 ENCOUNTER — Encounter: Payer: Self-pay | Admitting: *Deleted

## 2014-05-01 ENCOUNTER — Ambulatory Visit: Payer: Medicare Other | Admitting: Internal Medicine

## 2014-05-27 ENCOUNTER — Other Ambulatory Visit: Payer: Self-pay | Admitting: Internal Medicine

## 2014-06-06 ENCOUNTER — Other Ambulatory Visit: Payer: Self-pay | Admitting: Internal Medicine

## 2014-07-17 ENCOUNTER — Encounter: Payer: Self-pay | Admitting: Internal Medicine

## 2014-07-17 ENCOUNTER — Ambulatory Visit (INDEPENDENT_AMBULATORY_CARE_PROVIDER_SITE_OTHER): Payer: Medicare Other | Admitting: Internal Medicine

## 2014-07-17 VITALS — BP 160/80 | HR 75 | Ht 61.5 in | Wt 239.4 lb

## 2014-07-17 DIAGNOSIS — I4581 Long QT syndrome: Secondary | ICD-10-CM

## 2014-07-17 DIAGNOSIS — R9431 Abnormal electrocardiogram [ECG] [EKG]: Secondary | ICD-10-CM

## 2014-07-17 LAB — BASIC METABOLIC PANEL
BUN: 21 mg/dL (ref 6–23)
CO2: 25 meq/L (ref 19–32)
CREATININE: 1.05 mg/dL (ref 0.40–1.20)
Calcium: 9.1 mg/dL (ref 8.4–10.5)
Chloride: 106 mEq/L (ref 96–112)
GFR: 66.81 mL/min (ref >60.00)
GLUCOSE: 86 mg/dL (ref 70–99)
POTASSIUM: 4.2 meq/L (ref 3.5–5.1)
Sodium: 138 mEq/L (ref 135–145)

## 2014-07-17 MED ORDER — FUROSEMIDE 40 MG PO TABS: ORAL_TABLET | ORAL | Status: DC

## 2014-07-17 NOTE — Patient Instructions (Addendum)
Medication Instructions:  Your physician has recommended you make the following change in your medication:  1) START Lasix (Furosemide) 40 mg twice per week  Labwork: Lab today: BMET  Your physician recommends that you return for lab work in: 3 weeks for a BMET   Testing/Procedures: None  Follow-Up: Your physician wants you to follow-up in: 1 year with Dr. Caryl Comes.  You will receive a reminder letter in the mail two months in advance. If you don't receive a letter, please call our office to schedule the follow-up appointment.   Any Other Special Instructions Will Be Listed Below (If Applicable). Call you PCP with blood pressure readings.

## 2014-07-17 NOTE — Progress Notes (Signed)
Patient Care Team: Sharilyn Sites, MD as PCP - General (Family Medicine)   HPI  Diana Baker is a 69 y.o. female Seen in followup for TdP hospitalizaiton 2/2 hypokalemia  Has no prior history of ectopy and no family history.  The patient denies chest pain, shortness of breath, She has had some peripheral edema. Her diet is salt replete.  She's also has some palpitations of late. These are relatively brief. There've been no associated symptoms.     There have been no palpitations, lightheadedness or syncope.  All drugs have been tested against daily drugs.org website    Past Medical History  Diagnosis Date  . Hypertension   . Asthma   . Bronchiectasis   . DJD (degenerative joint disease)   . 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 Diastolic CHF     in setting of anemia and PMVT 3/13 req. IV diuresis  . Normocytic anemia     req txn with PRBCs during 3/13 admission; Fe 37, TIBC 185, Ferritin 275, RBC folate 506, B12 719 (06/2011)  . Pulmonary nodules   . Rheumatoid arthritis(714.0)   . Hypokalemia 06/2011    admx with PMVT/Torsades de Pointes    Past Surgical History  Procedure Laterality Date  . None      Current Outpatient Prescriptions  Medication Sig Dispense Refill  . ferrous sulfate 325 (65 FE) MG EC tablet Take 325 mg by mouth 2 (two) times daily.    . folic acid (FOLVITE) 1 MG tablet Take 1 mg by mouth daily.    . hydroxychloroquine (PLAQUENIL) 200 MG tablet 200 mg daily.    . irbesartan (AVAPRO) 300 MG tablet TAKE ONE TABLET BY MOUTH ONCE DAILY 30 tablet 1  . methotrexate (RHEUMATREX) 2.5 MG tablet as directed.    . naproxen sodium (ANAPROX) 220 MG tablet Take 440 mg by mouth as needed. FOR PAIN    . spironolactone (ALDACTONE) 25 MG tablet TAKE ONE TABLET BY MOUTH  TWICE DAILY 60 tablet 1   No current facility-administered medications for this visit.    No Known Allergies  Review of Systems negative except from HPI and PMH  Physical Exam BP 160/80 mmHg  Pulse 75  Ht 5' 1.5" (1.562 m)  Wt 239 lb 6.4 oz (108.591 kg)  BMI 44.51 kg/m2 Well developed and well nourished in no acute distress HENT normal E scleral and icterus clear Neck Supple JVP . 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  we also discussed the importance of salt restriction and fluid restriction. This does not seem likely  given her affinity for sodium in her diet   I'm concerned about her palpitations. If they persist we will use a event recorder to try to make sure they do not represent short runs of nonsustained polymorphic ventricular tachycardia    We'll check a metabolic profile today as it's been a year.  Her QT interval is prolonged today.   blood pressure is elevated. She has a blood pressure machine at home I've asked her record it weekly and follow-up with her PCP about this I will be in touch with him about this also.

## 2014-07-25 ENCOUNTER — Other Ambulatory Visit: Payer: Self-pay | Admitting: Internal Medicine

## 2014-08-06 ENCOUNTER — Other Ambulatory Visit: Payer: Self-pay | Admitting: Internal Medicine

## 2014-08-07 ENCOUNTER — Other Ambulatory Visit (INDEPENDENT_AMBULATORY_CARE_PROVIDER_SITE_OTHER): Payer: Medicare Other | Admitting: *Deleted

## 2014-08-07 DIAGNOSIS — I4581 Long QT syndrome: Secondary | ICD-10-CM | POA: Diagnosis not present

## 2014-08-07 DIAGNOSIS — R9431 Abnormal electrocardiogram [ECG] [EKG]: Secondary | ICD-10-CM

## 2014-08-07 LAB — BASIC METABOLIC PANEL
BUN: 15 mg/dL (ref 6–23)
CO2: 27 mEq/L (ref 19–32)
CREATININE: 1.1 mg/dL (ref 0.40–1.20)
Calcium: 9.1 mg/dL (ref 8.4–10.5)
Chloride: 104 mEq/L (ref 96–112)
GFR: 63.31 mL/min (ref >60.00)
Glucose, Bld: 92 mg/dL (ref 70–99)
Potassium: 3.6 mEq/L (ref 3.5–5.1)
Sodium: 139 mEq/L (ref 135–145)

## 2014-08-07 NOTE — Addendum Note (Signed)
Addended by: Eulis Foster on: 08/07/2014 08:12 AM   Modules accepted: Orders

## 2014-11-19 ENCOUNTER — Other Ambulatory Visit: Payer: Self-pay | Admitting: *Deleted

## 2014-11-19 MED ORDER — IRBESARTAN 300 MG PO TABS: 300.0000 mg | ORAL_TABLET | Freq: Every day | ORAL | Status: DC

## 2014-12-25 ENCOUNTER — Other Ambulatory Visit: Payer: Self-pay

## 2014-12-25 MED ORDER — HYDROXYCHLOROQUINE SULFATE 200 MG PO TABS: 200.0000 mg | ORAL_TABLET | Freq: Every day | ORAL | Status: AC

## 2014-12-30 ENCOUNTER — Telehealth: Payer: Self-pay

## 2014-12-30 NOTE — Telephone Encounter (Signed)
Patient called in stating she got refill for her PLAQUENIL and it was for once daily, her chart says take once daily but she has been taking it twice daily. She would like a call back to discuss further. [phone]

## 2014-12-31 NOTE — Telephone Encounter (Signed)
Per Dr. Caryl Comes, he does not fill this drug.

## 2015-01-20 ENCOUNTER — Telehealth: Payer: Self-pay

## 2015-01-20 NOTE — Telephone Encounter (Signed)
Zeba and explained Plaquenil was sent in by error on 12/25/14 and we needed to cancel out remaining refill. Per Maudie Mercury at [facility] the Plaquenil refill has been deactivated and patients PCP had sent her in a new refill on 01/12/15.

## 2015-02-17 ENCOUNTER — Other Ambulatory Visit (HOSPITAL_COMMUNITY): Payer: Self-pay | Admitting: Family Medicine

## 2015-02-17 DIAGNOSIS — Z1231 Encounter for screening mammogram for malignant neoplasm of breast: Secondary | ICD-10-CM

## 2015-02-19 ENCOUNTER — Ambulatory Visit (HOSPITAL_COMMUNITY)
Admission: RE | Admit: 2015-02-19 | Discharge: 2015-02-19 | Disposition: A | Discharge status: Home / Self Care | Payer: Medicare Other | Source: Ambulatory Visit | Attending: Family Medicine | Admitting: Family Medicine

## 2015-02-19 DIAGNOSIS — Z1231 Encounter for screening mammogram for malignant neoplasm of breast: Secondary | ICD-10-CM | POA: Insufficient documentation

## 2015-04-14 DIAGNOSIS — Z1389 Encounter for screening for other disorder: Secondary | ICD-10-CM | POA: Diagnosis not present

## 2015-04-14 DIAGNOSIS — H6501 Acute serous otitis media, right ear: Secondary | ICD-10-CM | POA: Diagnosis not present

## 2015-04-14 DIAGNOSIS — Z6841 Body Mass Index (BMI) 40.0 and over, adult: Secondary | ICD-10-CM | POA: Diagnosis not present

## 2015-06-22 DIAGNOSIS — M0600 Rheumatoid arthritis without rheumatoid factor, unspecified site: Secondary | ICD-10-CM | POA: Diagnosis not present

## 2015-06-22 DIAGNOSIS — Z79899 Other long term (current) drug therapy: Secondary | ICD-10-CM | POA: Diagnosis not present

## 2015-08-11 ENCOUNTER — Other Ambulatory Visit: Payer: Self-pay

## 2015-08-11 ENCOUNTER — Other Ambulatory Visit: Payer: Self-pay | Admitting: Internal Medicine

## 2015-08-11 MED ORDER — SPIRONOLACTONE 25 MG PO TABS: 25.0000 mg | ORAL_TABLET | Freq: Two times a day (BID) | ORAL | Status: DC

## 2015-08-21 ENCOUNTER — Other Ambulatory Visit: Payer: Self-pay | Admitting: Internal Medicine

## 2015-09-14 ENCOUNTER — Encounter: Payer: Self-pay | Admitting: Internal Medicine

## 2015-09-14 ENCOUNTER — Ambulatory Visit (INDEPENDENT_AMBULATORY_CARE_PROVIDER_SITE_OTHER): Payer: Commercial Managed Care - HMO | Admitting: Internal Medicine

## 2015-09-14 VITALS — BP 138/78 | HR 79 | Ht 62.0 in | Wt 235.0 lb

## 2015-09-14 DIAGNOSIS — I4581 Long QT syndrome: Secondary | ICD-10-CM | POA: Diagnosis not present

## 2015-09-14 DIAGNOSIS — R9431 Abnormal electrocardiogram [ECG] [EKG]: Secondary | ICD-10-CM

## 2015-09-14 NOTE — Patient Instructions (Signed)
Medication Instructions: - Your physician recommends that you continue on your current medications as directed. Please refer to the Current Medication list given to you today.  Labwork: - Please take the lab order given to you today when you go for your next lab draw- BMP/ Magnesium  Procedures/Testing: - none  Follow-Up: - Your physician wants you to follow-up in: 1 year with Dr. Caryl Comes. You will receive a reminder letter in the mail two months in advance. If you don't receive a letter, please call our office to schedule the follow-up appointment.  Any Additional Special Instructions Will Be Listed Below (If Applicable).     If you need a refill on your cardiac medications before your next appointment, please call your pharmacy.

## 2015-09-14 NOTE — Progress Notes (Signed)
      Patient Care Team: Sharilyn Sites, MD as PCP - General (Family Medicine)   HPI  Diana Baker is a 70 y.o. female Seen in followup for TdP hospitalizaiton 2/2 hypokalemia  Has no prior history of ectopy and no family history.  The patient denies chest pain, shortness of breath, No lightheadedness or palpitations       Past Medical History  Diagnosis Date  . Hypertension   . Asthma   . Bronchiectasis   . DJD (degenerative joint disease)   . Polymorphic ventricular tachycardia ([place]) 06/2011    PMVT in setting of prolonged QT c/w Torsades due to hypoK+ (2.0);  Echo 3/13:  abnl septal motion, normal wall thickness, EF 50%, mild MR, mild LAE;  Myoview 4/13: EF 64%, no ischemia  . Diastolic CHF ([place])     in setting of anemia and PMVT 3/13 req. IV diuresis  . Normocytic anemia     req txn with PRBCs during 3/13 admission; Fe 37, TIBC 185, Ferritin 275, RBC folate 506, B12 719 (06/2011)  . Pulmonary nodules   . Rheumatoid arthritis(714.0)   . Hypokalemia 06/2011    admx with PMVT/Torsades de Pointes    Past Surgical History  Procedure Laterality Date  . None      Current Outpatient Prescriptions  Medication Sig Dispense Refill  . ferrous sulfate 325 (65 FE) MG EC tablet Take 325 mg by mouth 2 (two) times daily.    . folic acid (FOLVITE) 1 MG tablet Take 1 mg by mouth daily.    . furosemide (LASIX) 40 MG tablet Take 1 tablet (40 mg total) twice a week. 8 tablet 6  . hydroxychloroquine (PLAQUENIL) 200 MG tablet Take 1 tablet (200 mg total) by mouth daily. 90 tablet 1  . irbesartan (AVAPRO) 300 MG tablet TAKE ONE TABLET BY MOUTH ONCE DAILY 90 tablet 0  . methotrexate (RHEUMATREX) 2.5 MG tablet as directed.    . naproxen sodium (ANAPROX) 220 MG tablet Take 440 mg by mouth as needed. FOR PAIN    . spironolactone (ALDACTONE) 25 MG tablet Take 1 tablet (25 mg total) by mouth 2 (two) times daily. 60 tablet 1   No current facility-administered medications for this visit.    No  Known Allergies  Review of Systems negative except from HPI and PMH  Physical Exam BP 138/78 mmHg  Pulse 79  Ht 5\' 2"  (1.575 m)  Wt 235 lb (106.595 kg)  BMI 42.97 kg/m2 Well developed and well nourished in no acute distress HENT normal E scleral and icterus clear Neck Supple JVP . 7-8 cm; carotids brisk and full Clear to ausculation  Regular rate and rhythm, no murmurs gallops or rub Soft with active bowel sounds No clubbing cyanosis tr Edema Alert and oriented, grossly normal motor and sensory function Skin Warm and Dry n   ECG was ordered today and demonstrated sinus rhythm at 75 Intervals 14/09/4 with a QTC of 449    Assessment and Plan Hypertension  Palpitations  Long QT associated with hypokalemia\  HFpEF  Euvolemic continue current meds  BP well controlled   Labs are monitored by rheumatology. I have asked that they check her   potassium every 3 months. I've instructed the patient that her targets are 3.5-5.0 she is reminded about the QT drugs.origin website

## 2015-09-22 DIAGNOSIS — M069 Rheumatoid arthritis, unspecified: Secondary | ICD-10-CM | POA: Diagnosis not present

## 2015-09-24 ENCOUNTER — Encounter: Payer: Self-pay | Admitting: Internal Medicine

## 2015-09-24 ENCOUNTER — Telehealth: Payer: Self-pay | Admitting: Internal Medicine

## 2015-09-24 NOTE — Telephone Encounter (Signed)
New message      FYI to let MD know of levels, Potassium 4.7 and Magnesium 2.2

## 2015-09-24 NOTE — Telephone Encounter (Signed)
Will forward to MD to review 

## 2015-10-12 ENCOUNTER — Other Ambulatory Visit: Payer: Self-pay | Admitting: Internal Medicine

## 2015-10-12 NOTE — Telephone Encounter (Signed)
AVS Reports     Date/Time Report Action User    09/14/2015 4:18 PM After Visit Summary Printed Emily Filbert, RN      Patient Instructions     Medication Instructions: - Your physician recommends that you continue on your current medications as directed. Please refer to the Current Medication list given to you today.

## 2015-11-16 ENCOUNTER — Other Ambulatory Visit: Payer: Self-pay | Admitting: Internal Medicine

## 2015-11-17 ENCOUNTER — Other Ambulatory Visit: Payer: Self-pay

## 2015-11-17 MED ORDER — IRBESARTAN 300 MG PO TABS: 300.0000 mg | ORAL_TABLET | Freq: Every day | ORAL | 3 refills | Status: DC

## 2015-12-21 ENCOUNTER — Other Ambulatory Visit (HOSPITAL_COMMUNITY): Payer: Self-pay | Admitting: Family Medicine

## 2015-12-21 ENCOUNTER — Ambulatory Visit (HOSPITAL_COMMUNITY)
Admission: RE | Admit: 2015-12-21 | Discharge: 2015-12-21 | Disposition: A | Discharge status: Home / Self Care | Payer: Commercial Managed Care - HMO | Source: Ambulatory Visit | Attending: Family Medicine | Admitting: Family Medicine

## 2015-12-21 DIAGNOSIS — Z6841 Body Mass Index (BMI) 40.0 and over, adult: Secondary | ICD-10-CM | POA: Diagnosis not present

## 2015-12-21 DIAGNOSIS — I700 Atherosclerosis of aorta: Secondary | ICD-10-CM | POA: Insufficient documentation

## 2015-12-21 DIAGNOSIS — J209 Acute bronchitis, unspecified: Secondary | ICD-10-CM | POA: Insufficient documentation

## 2015-12-21 DIAGNOSIS — I10 Essential (primary) hypertension: Secondary | ICD-10-CM | POA: Diagnosis not present

## 2015-12-21 DIAGNOSIS — R0602 Shortness of breath: Secondary | ICD-10-CM | POA: Diagnosis not present

## 2015-12-21 DIAGNOSIS — R05 Cough: Secondary | ICD-10-CM | POA: Diagnosis not present

## 2015-12-21 DIAGNOSIS — J069 Acute upper respiratory infection, unspecified: Secondary | ICD-10-CM | POA: Insufficient documentation

## 2015-12-21 DIAGNOSIS — J208 Acute bronchitis due to other specified organisms: Secondary | ICD-10-CM

## 2015-12-21 DIAGNOSIS — Z1389 Encounter for screening for other disorder: Secondary | ICD-10-CM | POA: Diagnosis not present

## 2015-12-21 DIAGNOSIS — M069 Rheumatoid arthritis, unspecified: Secondary | ICD-10-CM | POA: Diagnosis not present

## 2015-12-29 DIAGNOSIS — M069 Rheumatoid arthritis, unspecified: Secondary | ICD-10-CM | POA: Diagnosis not present

## 2015-12-29 DIAGNOSIS — Z79899 Other long term (current) drug therapy: Secondary | ICD-10-CM | POA: Diagnosis not present

## 2016-02-05 DIAGNOSIS — M069 Rheumatoid arthritis, unspecified: Secondary | ICD-10-CM | POA: Diagnosis not present

## 2016-03-07 ENCOUNTER — Other Ambulatory Visit (HOSPITAL_COMMUNITY): Payer: Self-pay | Admitting: Family Medicine

## 2016-03-07 DIAGNOSIS — Z1231 Encounter for screening mammogram for malignant neoplasm of breast: Secondary | ICD-10-CM

## 2016-03-23 ENCOUNTER — Ambulatory Visit (HOSPITAL_COMMUNITY)
Admission: RE | Admit: 2016-03-23 | Discharge: 2016-03-23 | Disposition: A | Discharge status: Home / Self Care | Payer: Commercial Managed Care - HMO | Source: Ambulatory Visit | Attending: Family Medicine | Admitting: Family Medicine

## 2016-03-23 DIAGNOSIS — Z1231 Encounter for screening mammogram for malignant neoplasm of breast: Secondary | ICD-10-CM | POA: Diagnosis not present

## 2016-04-06 DIAGNOSIS — M069 Rheumatoid arthritis, unspecified: Secondary | ICD-10-CM | POA: Diagnosis not present

## 2016-04-06 DIAGNOSIS — Z79899 Other long term (current) drug therapy: Secondary | ICD-10-CM | POA: Diagnosis not present

## 2016-06-07 ENCOUNTER — Ambulatory Visit (HOSPITAL_COMMUNITY)
Admission: RE | Admit: 2016-06-07 | Discharge: 2016-06-07 | Disposition: A | Discharge status: Home / Self Care | Payer: Commercial Managed Care - HMO | Source: Ambulatory Visit | Attending: Family Medicine | Admitting: Family Medicine

## 2016-06-07 ENCOUNTER — Other Ambulatory Visit (HOSPITAL_COMMUNITY): Payer: Self-pay | Admitting: Family Medicine

## 2016-06-07 DIAGNOSIS — J069 Acute upper respiratory infection, unspecified: Secondary | ICD-10-CM | POA: Diagnosis not present

## 2016-06-07 DIAGNOSIS — Z1389 Encounter for screening for other disorder: Secondary | ICD-10-CM | POA: Diagnosis not present

## 2016-06-07 DIAGNOSIS — R918 Other nonspecific abnormal finding of lung field: Secondary | ICD-10-CM | POA: Diagnosis not present

## 2016-06-07 DIAGNOSIS — J209 Acute bronchitis, unspecified: Secondary | ICD-10-CM

## 2016-06-07 DIAGNOSIS — M069 Rheumatoid arthritis, unspecified: Secondary | ICD-10-CM | POA: Diagnosis not present

## 2016-06-07 DIAGNOSIS — Z6841 Body Mass Index (BMI) 40.0 and over, adult: Secondary | ICD-10-CM | POA: Diagnosis not present

## 2016-09-08 DIAGNOSIS — Z79899 Other long term (current) drug therapy: Secondary | ICD-10-CM | POA: Diagnosis not present

## 2016-09-08 DIAGNOSIS — M25561 Pain in right knee: Secondary | ICD-10-CM | POA: Diagnosis not present

## 2016-09-08 DIAGNOSIS — M069 Rheumatoid arthritis, unspecified: Secondary | ICD-10-CM | POA: Diagnosis not present

## 2016-09-21 ENCOUNTER — Ambulatory Visit (INDEPENDENT_AMBULATORY_CARE_PROVIDER_SITE_OTHER): Payer: Medicare HMO | Admitting: Internal Medicine

## 2016-09-21 ENCOUNTER — Encounter (INDEPENDENT_AMBULATORY_CARE_PROVIDER_SITE_OTHER): Payer: Self-pay

## 2016-09-21 VITALS — BP 160/76 | HR 88 | Ht 61.0 in | Wt 244.0 lb

## 2016-09-21 DIAGNOSIS — I5030 Unspecified diastolic (congestive) heart failure: Secondary | ICD-10-CM | POA: Diagnosis not present

## 2016-09-21 DIAGNOSIS — R9431 Abnormal electrocardiogram [ECG] [EKG]: Secondary | ICD-10-CM

## 2016-09-21 DIAGNOSIS — I10 Essential (primary) hypertension: Secondary | ICD-10-CM

## 2016-09-21 DIAGNOSIS — R002 Palpitations: Secondary | ICD-10-CM

## 2016-09-21 MED ORDER — FUROSEMIDE 40 MG PO TABS: ORAL_TABLET | ORAL | 3 refills | Status: DC

## 2016-09-21 NOTE — Progress Notes (Signed)
Patient Care Team: Sharilyn Sites, MD as PCP - General (Family Medicine)   HPI  Diana Baker is a 71 y.o. female Seen in followup for TdP hospitalizaiton 2/2 hypokalemia  Has no prior history of ectopy and no family history.    Date Cr K Mg  4/16  1.1 3.6    6/17                 1.4 4.7      2/18                1.1 3.9           She has dyspnea on exertion Is relieved by rest. It is associated With peripheral edema and some abdominal swelling. Her salt intake in her diet is quite replete.  She has sleep disordered breathing and daytime somnolence.  She does not have orthopnea or nocturnal dyspnea.  She denies chest pain.       Past Medical History:  Diagnosis Date  . Asthma   . Bronchiectasis   . Diastolic CHF ([place])    in setting of anemia and PMVT 3/13 req. IV diuresis  . DJD (degenerative joint disease)   . Hypertension   . Hypokalemia 06/2011   admx with PMVT/Torsades de Pointes  . Normocytic anemia    req txn with PRBCs during 3/13 admission; Fe 37, TIBC 185, Ferritin 275, RBC folate 506, B12 719 (06/2011)  . Polymorphic ventricular tachycardia ([place]) 06/2011   PMVT in setting of prolonged QT c/w Torsades due to hypoK+ (2.0);  Echo 3/13:  abnl septal motion, normal wall thickness, EF 50%, mild MR, mild LAE;  Myoview 4/13: EF 64%, no ischemia  . Pulmonary nodules   . Rheumatoid arthritis(714.0)     Past Surgical History:  Procedure Laterality Date  . none      Current Outpatient Prescriptions  Medication Sig Dispense Refill  . folic acid (FOLVITE) 1 MG tablet Take 1 mg by mouth daily.    . furosemide (LASIX) 40 MG tablet Take 1 tablet (40 mg total) twice a week. 8 tablet 6  . hydroxychloroquine (PLAQUENIL) 200 MG tablet Take 1 tablet (200 mg total) by mouth daily. 90 tablet 1  . irbesartan (AVAPRO) 300 MG tablet Take 1 tablet (300 mg total) by mouth daily. 90 tablet 3  . methotrexate (RHEUMATREX) 2.5 MG tablet Take 4 tablets by mouth on saturdays    .  naproxen sodium (ANAPROX) 220 MG tablet Take 440 mg by mouth as needed. FOR PAIN    . spironolactone (ALDACTONE) 25 MG tablet TAKE ONE TABLET BY MOUTH TWICE DAILY 60 tablet 10   No current facility-administered medications for this visit.     No Known Allergies  Review of Systems negative except from HPI and PMH  Physical Exam BP (!) 160/76   Pulse 88   Ht 5\' 1"  (1.549 m)   Wt 244 lb (110.7 kg)   SpO2 99%   BMI 46.10 kg/m  Well developed and nourished in no acute distress HENT normal Neck supple with JVP-8 Carotids brisk and full without bruits Clear Regular rate and rhythm, no murmurs or gallops Abd-soft with active BS without hepatomegaly No Clubbing cyanosis 1-2+edema Skin-warm and dry A & Oriented  Grossly normal sensory and motor function    ECG was ordered today and demonstrated sinus rhythm at 75 Intervals 14/09/44   QTC of 486  Assessment and Plan Hypertension  Long QT associated with hypokalemia  Sleep disordered breathing   HFpEF  No intercurrent arrhythmias.  Potassium is being followed closely by her rheumatologist. She is reminded again about the QT drugs.org website    she is volume overloaded. We will resume her Lasix. She will take it at most 2 days a week. We have reviewed his potential for potassium depletion. We have also discussed the preferred alternative which did decrease sodium intake and have discussed various  alternatives for seasoning sodium.  She has sleep disordered breathing. In the context of her poorly controlled blood pressure, she needs a sleep study. Blood pressures and other offices have not been quite so high is here. We'll defer management to her primary care physician's I suspect is somewhat salt dependent and so she diuresis may well improve

## 2016-09-21 NOTE — Patient Instructions (Signed)
Medication Instructions: - Your physician recommends that you continue on your current medications as directed. Please refer to the Current Medication list given to you today.  Labwork: - none ordered  Procedures/Testing: - none ordered  Follow-Up: - Your physician wants you to follow-up in: 2 years with Dr. Caryl Comes. You will receive a reminder letter in the mail two months in advance. If you don't receive a letter, please call our office to schedule the follow-up appointment.   Any Additional Special Instructions Will Be Listed Below (If Applicable). - You will need a sleep study- Dr. Caryl Comes will send your primary care physician a note about this    If you need a refill on your cardiac medications before your next appointment, please call your pharmacy.

## 2016-09-22 ENCOUNTER — Other Ambulatory Visit: Payer: Self-pay | Admitting: Internal Medicine

## 2016-11-20 ENCOUNTER — Other Ambulatory Visit: Payer: Self-pay | Admitting: Internal Medicine

## 2016-12-08 DIAGNOSIS — M7989 Other specified soft tissue disorders: Secondary | ICD-10-CM | POA: Diagnosis not present

## 2016-12-08 DIAGNOSIS — M069 Rheumatoid arthritis, unspecified: Secondary | ICD-10-CM | POA: Diagnosis not present

## 2016-12-08 DIAGNOSIS — Z79899 Other long term (current) drug therapy: Secondary | ICD-10-CM | POA: Diagnosis not present

## 2017-01-09 DIAGNOSIS — Z1389 Encounter for screening for other disorder: Secondary | ICD-10-CM | POA: Diagnosis not present

## 2017-01-09 DIAGNOSIS — M25551 Pain in right hip: Secondary | ICD-10-CM | POA: Diagnosis not present

## 2017-01-09 DIAGNOSIS — Z6841 Body Mass Index (BMI) 40.0 and over, adult: Secondary | ICD-10-CM | POA: Diagnosis not present

## 2017-05-30 ENCOUNTER — Other Ambulatory Visit (HOSPITAL_COMMUNITY): Payer: Self-pay | Admitting: Family Medicine

## 2017-05-30 DIAGNOSIS — Z1231 Encounter for screening mammogram for malignant neoplasm of breast: Secondary | ICD-10-CM

## 2017-06-08 ENCOUNTER — Encounter (HOSPITAL_COMMUNITY): Payer: Self-pay

## 2017-06-08 ENCOUNTER — Ambulatory Visit (HOSPITAL_COMMUNITY)
Admission: RE | Admit: 2017-06-08 | Discharge: 2017-06-08 | Disposition: A | Discharge status: Home / Self Care | Payer: Medicare HMO | Source: Ambulatory Visit | Attending: Family Medicine | Admitting: Family Medicine

## 2017-06-08 DIAGNOSIS — Z1231 Encounter for screening mammogram for malignant neoplasm of breast: Secondary | ICD-10-CM | POA: Diagnosis not present

## 2017-07-27 DIAGNOSIS — M069 Rheumatoid arthritis, unspecified: Secondary | ICD-10-CM | POA: Diagnosis not present

## 2017-07-27 DIAGNOSIS — M7989 Other specified soft tissue disorders: Secondary | ICD-10-CM | POA: Diagnosis not present

## 2017-07-27 DIAGNOSIS — M25431 Effusion, right wrist: Secondary | ICD-10-CM | POA: Diagnosis not present

## 2017-07-27 DIAGNOSIS — Z79899 Other long term (current) drug therapy: Secondary | ICD-10-CM | POA: Diagnosis not present

## 2017-09-26 DIAGNOSIS — Z79899 Other long term (current) drug therapy: Secondary | ICD-10-CM | POA: Diagnosis not present

## 2017-09-26 DIAGNOSIS — M25539 Pain in unspecified wrist: Secondary | ICD-10-CM | POA: Diagnosis not present

## 2017-09-26 DIAGNOSIS — M069 Rheumatoid arthritis, unspecified: Secondary | ICD-10-CM | POA: Diagnosis not present

## 2017-09-26 DIAGNOSIS — M7989 Other specified soft tissue disorders: Secondary | ICD-10-CM | POA: Diagnosis not present

## 2017-10-06 ENCOUNTER — Other Ambulatory Visit: Payer: Self-pay | Admitting: Internal Medicine

## 2017-11-08 DIAGNOSIS — Z79899 Other long term (current) drug therapy: Secondary | ICD-10-CM | POA: Diagnosis not present

## 2017-11-21 ENCOUNTER — Other Ambulatory Visit: Payer: Self-pay

## 2017-11-21 ENCOUNTER — Other Ambulatory Visit: Payer: Self-pay | Admitting: Internal Medicine

## 2017-11-21 MED ORDER — SPIRONOLACTONE 25 MG PO TABS: 25.0000 mg | ORAL_TABLET | Freq: Two times a day (BID) | ORAL | 0 refills | Status: DC

## 2017-11-21 MED ORDER — IRBESARTAN 300 MG PO TABS: 300.0000 mg | ORAL_TABLET | Freq: Every day | ORAL | 8 refills | Status: DC

## 2017-11-21 MED ORDER — SPIRONOLACTONE 25 MG PO TABS: 25.0000 mg | ORAL_TABLET | Freq: Two times a day (BID) | ORAL | 8 refills | Status: DC

## 2017-11-21 MED ORDER — IRBESARTAN 300 MG PO TABS: 300.0000 mg | ORAL_TABLET | Freq: Every day | ORAL | 0 refills | Status: DC

## 2017-11-21 NOTE — Addendum Note (Signed)
Addended by: Juventino Slovak on: 11/21/2017 05:07 PM   Modules accepted: Orders

## 2018-01-04 DIAGNOSIS — M069 Rheumatoid arthritis, unspecified: Secondary | ICD-10-CM | POA: Diagnosis not present

## 2018-01-04 DIAGNOSIS — Z79899 Other long term (current) drug therapy: Secondary | ICD-10-CM | POA: Diagnosis not present

## 2018-01-24 DIAGNOSIS — Z79899 Other long term (current) drug therapy: Secondary | ICD-10-CM | POA: Diagnosis not present

## 2018-04-27 DIAGNOSIS — M069 Rheumatoid arthritis, unspecified: Secondary | ICD-10-CM | POA: Diagnosis not present

## 2018-04-27 DIAGNOSIS — M25512 Pain in left shoulder: Secondary | ICD-10-CM | POA: Diagnosis not present

## 2018-04-27 DIAGNOSIS — Z79899 Other long term (current) drug therapy: Secondary | ICD-10-CM | POA: Diagnosis not present

## 2018-04-27 DIAGNOSIS — I10 Essential (primary) hypertension: Secondary | ICD-10-CM | POA: Diagnosis not present

## 2018-05-28 ENCOUNTER — Other Ambulatory Visit (HOSPITAL_COMMUNITY): Payer: Self-pay | Admitting: Family Medicine

## 2018-05-28 DIAGNOSIS — Z1231 Encounter for screening mammogram for malignant neoplasm of breast: Secondary | ICD-10-CM

## 2018-06-11 ENCOUNTER — Encounter (HOSPITAL_COMMUNITY): Payer: Self-pay

## 2018-06-11 ENCOUNTER — Ambulatory Visit (HOSPITAL_COMMUNITY)
Admission: RE | Admit: 2018-06-11 | Discharge: 2018-06-11 | Disposition: A | Discharge status: Home / Self Care | Payer: Medicare HMO | Source: Ambulatory Visit | Attending: Family Medicine | Admitting: Family Medicine

## 2018-06-11 DIAGNOSIS — Z1231 Encounter for screening mammogram for malignant neoplasm of breast: Secondary | ICD-10-CM | POA: Diagnosis not present

## 2018-07-30 DIAGNOSIS — I10 Essential (primary) hypertension: Secondary | ICD-10-CM | POA: Diagnosis not present

## 2018-07-30 DIAGNOSIS — Z79899 Other long term (current) drug therapy: Secondary | ICD-10-CM | POA: Diagnosis not present

## 2018-07-30 DIAGNOSIS — M069 Rheumatoid arthritis, unspecified: Secondary | ICD-10-CM | POA: Diagnosis not present

## 2018-07-30 DIAGNOSIS — Z8679 Personal history of other diseases of the circulatory system: Secondary | ICD-10-CM | POA: Diagnosis not present

## 2018-08-20 ENCOUNTER — Other Ambulatory Visit: Payer: Self-pay | Admitting: Internal Medicine

## 2018-08-27 ENCOUNTER — Other Ambulatory Visit: Payer: Self-pay | Admitting: Internal Medicine

## 2018-09-14 DIAGNOSIS — E7849 Other hyperlipidemia: Secondary | ICD-10-CM | POA: Diagnosis not present

## 2018-09-14 DIAGNOSIS — M069 Rheumatoid arthritis, unspecified: Secondary | ICD-10-CM | POA: Diagnosis not present

## 2018-09-14 DIAGNOSIS — Z1389 Encounter for screening for other disorder: Secondary | ICD-10-CM | POA: Diagnosis not present

## 2018-09-14 DIAGNOSIS — Z6841 Body Mass Index (BMI) 40.0 and over, adult: Secondary | ICD-10-CM | POA: Diagnosis not present

## 2018-09-14 DIAGNOSIS — F329 Major depressive disorder, single episode, unspecified: Secondary | ICD-10-CM | POA: Diagnosis not present

## 2018-09-14 DIAGNOSIS — I509 Heart failure, unspecified: Secondary | ICD-10-CM | POA: Diagnosis not present

## 2018-09-14 DIAGNOSIS — I10 Essential (primary) hypertension: Secondary | ICD-10-CM | POA: Diagnosis not present

## 2018-09-14 DIAGNOSIS — Z0001 Encounter for general adult medical examination with abnormal findings: Secondary | ICD-10-CM | POA: Diagnosis not present

## 2018-09-17 ENCOUNTER — Other Ambulatory Visit (HOSPITAL_COMMUNITY): Payer: Self-pay | Admitting: Family Medicine

## 2018-09-17 DIAGNOSIS — E2839 Other primary ovarian failure: Secondary | ICD-10-CM

## 2018-09-24 ENCOUNTER — Other Ambulatory Visit: Payer: Self-pay

## 2018-09-24 ENCOUNTER — Ambulatory Visit (HOSPITAL_COMMUNITY)
Admission: RE | Admit: 2018-09-24 | Discharge: 2018-09-24 | Disposition: A | Discharge status: Home / Self Care | Payer: Medicare HMO | Source: Ambulatory Visit | Attending: Family Medicine | Admitting: Family Medicine

## 2018-09-24 DIAGNOSIS — E2839 Other primary ovarian failure: Secondary | ICD-10-CM | POA: Diagnosis not present

## 2018-09-24 DIAGNOSIS — Z780 Asymptomatic menopausal state: Secondary | ICD-10-CM | POA: Diagnosis not present

## 2018-09-24 DIAGNOSIS — Z13820 Encounter for screening for osteoporosis: Secondary | ICD-10-CM | POA: Diagnosis not present

## 2018-10-29 DIAGNOSIS — M1990 Unspecified osteoarthritis, unspecified site: Secondary | ICD-10-CM | POA: Diagnosis not present

## 2018-10-29 DIAGNOSIS — I10 Essential (primary) hypertension: Secondary | ICD-10-CM | POA: Diagnosis not present

## 2018-10-29 DIAGNOSIS — M069 Rheumatoid arthritis, unspecified: Secondary | ICD-10-CM | POA: Diagnosis not present

## 2018-10-29 DIAGNOSIS — Z8679 Personal history of other diseases of the circulatory system: Secondary | ICD-10-CM | POA: Diagnosis not present

## 2018-10-29 DIAGNOSIS — Z79899 Other long term (current) drug therapy: Secondary | ICD-10-CM | POA: Diagnosis not present

## 2019-01-29 DIAGNOSIS — M069 Rheumatoid arthritis, unspecified: Secondary | ICD-10-CM | POA: Diagnosis not present

## 2019-01-29 DIAGNOSIS — I10 Essential (primary) hypertension: Secondary | ICD-10-CM | POA: Diagnosis not present

## 2019-01-29 DIAGNOSIS — Z79899 Other long term (current) drug therapy: Secondary | ICD-10-CM | POA: Diagnosis not present

## 2019-01-29 DIAGNOSIS — M1990 Unspecified osteoarthritis, unspecified site: Secondary | ICD-10-CM | POA: Diagnosis not present

## 2019-01-29 DIAGNOSIS — Z8679 Personal history of other diseases of the circulatory system: Secondary | ICD-10-CM | POA: Diagnosis not present

## 2019-04-30 DIAGNOSIS — Z79899 Other long term (current) drug therapy: Secondary | ICD-10-CM | POA: Diagnosis not present

## 2019-05-02 DIAGNOSIS — M069 Rheumatoid arthritis, unspecified: Secondary | ICD-10-CM | POA: Diagnosis not present

## 2019-05-02 DIAGNOSIS — I10 Essential (primary) hypertension: Secondary | ICD-10-CM | POA: Diagnosis not present

## 2019-05-02 DIAGNOSIS — M1990 Unspecified osteoarthritis, unspecified site: Secondary | ICD-10-CM | POA: Diagnosis not present

## 2019-05-02 DIAGNOSIS — Z79899 Other long term (current) drug therapy: Secondary | ICD-10-CM | POA: Diagnosis not present

## 2019-05-02 DIAGNOSIS — Z8679 Personal history of other diseases of the circulatory system: Secondary | ICD-10-CM | POA: Diagnosis not present

## 2019-05-09 ENCOUNTER — Other Ambulatory Visit (HOSPITAL_COMMUNITY): Payer: Self-pay | Admitting: Family Medicine

## 2019-05-09 DIAGNOSIS — Z1231 Encounter for screening mammogram for malignant neoplasm of breast: Secondary | ICD-10-CM

## 2019-05-23 DIAGNOSIS — Z79899 Other long term (current) drug therapy: Secondary | ICD-10-CM | POA: Diagnosis not present

## 2019-06-14 ENCOUNTER — Ambulatory Visit (HOSPITAL_COMMUNITY)
Admission: RE | Admit: 2019-06-14 | Discharge: 2019-06-14 | Disposition: A | Discharge status: Home / Self Care | Payer: Medicare HMO | Source: Ambulatory Visit | Attending: Family Medicine | Admitting: Family Medicine

## 2019-06-14 ENCOUNTER — Other Ambulatory Visit: Payer: Self-pay

## 2019-06-14 DIAGNOSIS — Z1231 Encounter for screening mammogram for malignant neoplasm of breast: Secondary | ICD-10-CM

## 2019-07-12 ENCOUNTER — Other Ambulatory Visit: Payer: Self-pay

## 2019-07-12 MED ORDER — SPIRONOLACTONE 25 MG PO TABS: 25.0000 mg | ORAL_TABLET | Freq: Two times a day (BID) | ORAL | 0 refills | Status: DC

## 2019-08-08 DIAGNOSIS — I509 Heart failure, unspecified: Secondary | ICD-10-CM | POA: Diagnosis not present

## 2019-08-08 DIAGNOSIS — Z8679 Personal history of other diseases of the circulatory system: Secondary | ICD-10-CM | POA: Diagnosis not present

## 2019-08-08 DIAGNOSIS — M1990 Unspecified osteoarthritis, unspecified site: Secondary | ICD-10-CM | POA: Diagnosis not present

## 2019-08-08 DIAGNOSIS — I10 Essential (primary) hypertension: Secondary | ICD-10-CM | POA: Diagnosis not present

## 2019-08-08 DIAGNOSIS — M069 Rheumatoid arthritis, unspecified: Secondary | ICD-10-CM | POA: Diagnosis not present

## 2019-08-08 DIAGNOSIS — Z79899 Other long term (current) drug therapy: Secondary | ICD-10-CM | POA: Diagnosis not present

## 2019-08-08 DIAGNOSIS — M059 Rheumatoid arthritis with rheumatoid factor, unspecified: Secondary | ICD-10-CM | POA: Diagnosis not present

## 2019-08-08 DIAGNOSIS — I110 Hypertensive heart disease with heart failure: Secondary | ICD-10-CM | POA: Diagnosis not present

## 2019-09-20 DIAGNOSIS — I10 Essential (primary) hypertension: Secondary | ICD-10-CM | POA: Diagnosis not present

## 2019-09-20 DIAGNOSIS — E7849 Other hyperlipidemia: Secondary | ICD-10-CM | POA: Diagnosis not present

## 2019-09-20 DIAGNOSIS — M159 Polyosteoarthritis, unspecified: Secondary | ICD-10-CM | POA: Diagnosis not present

## 2019-09-20 DIAGNOSIS — Z6841 Body Mass Index (BMI) 40.0 and over, adult: Secondary | ICD-10-CM | POA: Diagnosis not present

## 2019-09-20 DIAGNOSIS — Z0000 Encounter for general adult medical examination without abnormal findings: Secondary | ICD-10-CM | POA: Diagnosis not present

## 2019-09-20 DIAGNOSIS — E039 Hypothyroidism, unspecified: Secondary | ICD-10-CM | POA: Diagnosis not present

## 2019-09-20 DIAGNOSIS — Z1389 Encounter for screening for other disorder: Secondary | ICD-10-CM | POA: Diagnosis not present

## 2019-09-20 DIAGNOSIS — E782 Mixed hyperlipidemia: Secondary | ICD-10-CM | POA: Diagnosis not present

## 2019-09-20 DIAGNOSIS — M069 Rheumatoid arthritis, unspecified: Secondary | ICD-10-CM | POA: Diagnosis not present

## 2019-11-07 DIAGNOSIS — Z79899 Other long term (current) drug therapy: Secondary | ICD-10-CM | POA: Diagnosis not present

## 2019-11-07 DIAGNOSIS — I10 Essential (primary) hypertension: Secondary | ICD-10-CM | POA: Diagnosis not present

## 2019-11-07 DIAGNOSIS — Z8679 Personal history of other diseases of the circulatory system: Secondary | ICD-10-CM | POA: Diagnosis not present

## 2019-11-07 DIAGNOSIS — M069 Rheumatoid arthritis, unspecified: Secondary | ICD-10-CM | POA: Diagnosis not present

## 2019-11-07 DIAGNOSIS — M1990 Unspecified osteoarthritis, unspecified site: Secondary | ICD-10-CM | POA: Diagnosis not present

## 2020-01-23 ENCOUNTER — Ambulatory Visit: Payer: Medicare HMO | Attending: Internal Medicine

## 2020-01-23 DIAGNOSIS — Z23 Encounter for immunization: Secondary | ICD-10-CM

## 2020-01-23 NOTE — Progress Notes (Signed)
   Covid-19 Vaccination Clinic  Name:  Diana Baker    MRN: [id number] DOB: Mar 25, 1946  01/23/2020  [name] was observed post Covid-19 immunization for 15 minutes without incident. She was provided with Vaccine Information Sheet and instruction to access the V-Safe system.   [name] was instructed to call 911 with any severe reactions post vaccine: Marland Kitchen Difficulty breathing  . Swelling of face and throat  . A fast heartbeat  . A bad rash all over body  . Dizziness and weakness

## 2020-02-10 DIAGNOSIS — Z79899 Other long term (current) drug therapy: Secondary | ICD-10-CM | POA: Diagnosis not present

## 2020-02-10 DIAGNOSIS — I10 Essential (primary) hypertension: Secondary | ICD-10-CM | POA: Diagnosis not present

## 2020-02-10 DIAGNOSIS — Z8679 Personal history of other diseases of the circulatory system: Secondary | ICD-10-CM | POA: Diagnosis not present

## 2020-02-10 DIAGNOSIS — M069 Rheumatoid arthritis, unspecified: Secondary | ICD-10-CM | POA: Diagnosis not present

## 2020-02-10 DIAGNOSIS — M1990 Unspecified osteoarthritis, unspecified site: Secondary | ICD-10-CM | POA: Diagnosis not present

## 2020-04-30 DIAGNOSIS — Z79899 Other long term (current) drug therapy: Secondary | ICD-10-CM | POA: Diagnosis not present

## 2020-05-12 DIAGNOSIS — I10 Essential (primary) hypertension: Secondary | ICD-10-CM | POA: Diagnosis not present

## 2020-05-12 DIAGNOSIS — M1990 Unspecified osteoarthritis, unspecified site: Secondary | ICD-10-CM | POA: Diagnosis not present

## 2020-05-12 DIAGNOSIS — Z79899 Other long term (current) drug therapy: Secondary | ICD-10-CM | POA: Diagnosis not present

## 2020-05-12 DIAGNOSIS — M069 Rheumatoid arthritis, unspecified: Secondary | ICD-10-CM | POA: Diagnosis not present

## 2020-05-12 DIAGNOSIS — Z8679 Personal history of other diseases of the circulatory system: Secondary | ICD-10-CM | POA: Diagnosis not present

## 2020-05-20 DIAGNOSIS — M069 Rheumatoid arthritis, unspecified: Secondary | ICD-10-CM | POA: Diagnosis not present

## 2020-05-21 ENCOUNTER — Other Ambulatory Visit (HOSPITAL_COMMUNITY): Payer: Self-pay | Admitting: Family Medicine

## 2020-05-21 DIAGNOSIS — Z1231 Encounter for screening mammogram for malignant neoplasm of breast: Secondary | ICD-10-CM

## 2020-06-15 ENCOUNTER — Ambulatory Visit (HOSPITAL_COMMUNITY)
Admission: RE | Admit: 2020-06-15 | Discharge: 2020-06-15 | Disposition: A | Discharge status: Home / Self Care | Payer: Medicare HMO | Source: Ambulatory Visit | Attending: Family Medicine | Admitting: Family Medicine

## 2020-06-15 ENCOUNTER — Other Ambulatory Visit: Payer: Self-pay

## 2020-06-15 DIAGNOSIS — Z1231 Encounter for screening mammogram for malignant neoplasm of breast: Secondary | ICD-10-CM | POA: Diagnosis not present

## 2020-08-13 DIAGNOSIS — M1990 Unspecified osteoarthritis, unspecified site: Secondary | ICD-10-CM | POA: Diagnosis not present

## 2020-08-13 DIAGNOSIS — I10 Essential (primary) hypertension: Secondary | ICD-10-CM | POA: Diagnosis not present

## 2020-08-13 DIAGNOSIS — Z79899 Other long term (current) drug therapy: Secondary | ICD-10-CM | POA: Diagnosis not present

## 2020-08-13 DIAGNOSIS — Z8679 Personal history of other diseases of the circulatory system: Secondary | ICD-10-CM | POA: Diagnosis not present

## 2020-08-13 DIAGNOSIS — M069 Rheumatoid arthritis, unspecified: Secondary | ICD-10-CM | POA: Diagnosis not present

## 2020-09-02 DIAGNOSIS — Z79899 Other long term (current) drug therapy: Secondary | ICD-10-CM | POA: Diagnosis not present

## 2020-10-29 ENCOUNTER — Other Ambulatory Visit (HOSPITAL_COMMUNITY): Payer: Self-pay | Admitting: Family Medicine

## 2020-10-29 DIAGNOSIS — Z1389 Encounter for screening for other disorder: Secondary | ICD-10-CM | POA: Diagnosis not present

## 2020-10-29 DIAGNOSIS — Z0001 Encounter for general adult medical examination with abnormal findings: Secondary | ICD-10-CM | POA: Diagnosis not present

## 2020-10-29 DIAGNOSIS — Z6841 Body Mass Index (BMI) 40.0 and over, adult: Secondary | ICD-10-CM | POA: Diagnosis not present

## 2020-10-29 DIAGNOSIS — Z1331 Encounter for screening for depression: Secondary | ICD-10-CM | POA: Diagnosis not present

## 2020-10-29 DIAGNOSIS — M069 Rheumatoid arthritis, unspecified: Secondary | ICD-10-CM | POA: Diagnosis not present

## 2020-10-29 DIAGNOSIS — E2839 Other primary ovarian failure: Secondary | ICD-10-CM

## 2020-10-29 DIAGNOSIS — I10 Essential (primary) hypertension: Secondary | ICD-10-CM | POA: Diagnosis not present

## 2020-10-29 DIAGNOSIS — I509 Heart failure, unspecified: Secondary | ICD-10-CM | POA: Diagnosis not present

## 2020-10-29 DIAGNOSIS — E782 Mixed hyperlipidemia: Secondary | ICD-10-CM | POA: Diagnosis not present

## 2020-11-04 ENCOUNTER — Other Ambulatory Visit: Payer: Self-pay

## 2020-11-04 ENCOUNTER — Ambulatory Visit (HOSPITAL_COMMUNITY)
Admission: RE | Admit: 2020-11-04 | Discharge: 2020-11-04 | Disposition: A | Discharge status: Home / Self Care | Payer: Medicare HMO | Source: Ambulatory Visit | Attending: Family Medicine | Admitting: Family Medicine

## 2020-11-04 DIAGNOSIS — Z780 Asymptomatic menopausal state: Secondary | ICD-10-CM | POA: Diagnosis not present

## 2020-11-04 DIAGNOSIS — E2839 Other primary ovarian failure: Secondary | ICD-10-CM | POA: Diagnosis not present

## 2020-11-16 DIAGNOSIS — M25579 Pain in unspecified ankle and joints of unspecified foot: Secondary | ICD-10-CM | POA: Diagnosis not present

## 2020-11-16 DIAGNOSIS — M25561 Pain in right knee: Secondary | ICD-10-CM | POA: Diagnosis not present

## 2020-11-16 DIAGNOSIS — Z8679 Personal history of other diseases of the circulatory system: Secondary | ICD-10-CM | POA: Diagnosis not present

## 2020-11-16 DIAGNOSIS — M25572 Pain in left ankle and joints of left foot: Secondary | ICD-10-CM | POA: Diagnosis not present

## 2020-11-16 DIAGNOSIS — I10 Essential (primary) hypertension: Secondary | ICD-10-CM | POA: Diagnosis not present

## 2020-11-16 DIAGNOSIS — Z79899 Other long term (current) drug therapy: Secondary | ICD-10-CM | POA: Diagnosis not present

## 2020-11-16 DIAGNOSIS — M25562 Pain in left knee: Secondary | ICD-10-CM | POA: Diagnosis not present

## 2020-11-16 DIAGNOSIS — M25571 Pain in right ankle and joints of right foot: Secondary | ICD-10-CM | POA: Diagnosis not present

## 2020-11-16 DIAGNOSIS — M1990 Unspecified osteoarthritis, unspecified site: Secondary | ICD-10-CM | POA: Diagnosis not present

## 2020-11-16 DIAGNOSIS — M069 Rheumatoid arthritis, unspecified: Secondary | ICD-10-CM | POA: Diagnosis not present

## 2021-02-16 DIAGNOSIS — M1990 Unspecified osteoarthritis, unspecified site: Secondary | ICD-10-CM | POA: Diagnosis not present

## 2021-02-16 DIAGNOSIS — M25561 Pain in right knee: Secondary | ICD-10-CM | POA: Diagnosis not present

## 2021-02-16 DIAGNOSIS — I10 Essential (primary) hypertension: Secondary | ICD-10-CM | POA: Diagnosis not present

## 2021-02-16 DIAGNOSIS — Z8679 Personal history of other diseases of the circulatory system: Secondary | ICD-10-CM | POA: Diagnosis not present

## 2021-02-16 DIAGNOSIS — Z79899 Other long term (current) drug therapy: Secondary | ICD-10-CM | POA: Diagnosis not present

## 2021-02-16 DIAGNOSIS — M069 Rheumatoid arthritis, unspecified: Secondary | ICD-10-CM | POA: Diagnosis not present

## 2021-05-17 DIAGNOSIS — M25561 Pain in right knee: Secondary | ICD-10-CM | POA: Diagnosis not present

## 2021-05-17 DIAGNOSIS — Z8679 Personal history of other diseases of the circulatory system: Secondary | ICD-10-CM | POA: Diagnosis not present

## 2021-05-17 DIAGNOSIS — M1990 Unspecified osteoarthritis, unspecified site: Secondary | ICD-10-CM | POA: Diagnosis not present

## 2021-05-17 DIAGNOSIS — M069 Rheumatoid arthritis, unspecified: Secondary | ICD-10-CM | POA: Diagnosis not present

## 2021-05-17 DIAGNOSIS — Z79899 Other long term (current) drug therapy: Secondary | ICD-10-CM | POA: Diagnosis not present

## 2021-05-17 DIAGNOSIS — I10 Essential (primary) hypertension: Secondary | ICD-10-CM | POA: Diagnosis not present

## 2021-06-10 ENCOUNTER — Other Ambulatory Visit (HOSPITAL_COMMUNITY): Payer: Self-pay | Admitting: Family Medicine

## 2021-06-10 DIAGNOSIS — Z1231 Encounter for screening mammogram for malignant neoplasm of breast: Secondary | ICD-10-CM

## 2021-06-18 ENCOUNTER — Other Ambulatory Visit: Payer: Self-pay

## 2021-06-18 ENCOUNTER — Ambulatory Visit (HOSPITAL_COMMUNITY)
Admission: RE | Admit: 2021-06-18 | Discharge: 2021-06-18 | Disposition: A | Discharge status: Home / Self Care | Payer: Medicare PPO | Source: Ambulatory Visit | Attending: Family Medicine | Admitting: Family Medicine

## 2021-06-18 DIAGNOSIS — Z1231 Encounter for screening mammogram for malignant neoplasm of breast: Secondary | ICD-10-CM | POA: Insufficient documentation

## 2021-06-21 ENCOUNTER — Other Ambulatory Visit (HOSPITAL_COMMUNITY): Payer: Self-pay | Admitting: Family Medicine

## 2021-06-21 DIAGNOSIS — R928 Other abnormal and inconclusive findings on diagnostic imaging of breast: Secondary | ICD-10-CM

## 2021-06-22 ENCOUNTER — Encounter (HOSPITAL_COMMUNITY): Payer: Medicare PPO

## 2021-07-13 ENCOUNTER — Ambulatory Visit (HOSPITAL_COMMUNITY)
Admission: RE | Admit: 2021-07-13 | Discharge: 2021-07-13 | Disposition: A | Discharge status: Home / Self Care | Payer: Medicare PPO | Source: Ambulatory Visit | Attending: Family Medicine | Admitting: Family Medicine

## 2021-07-13 DIAGNOSIS — R928 Other abnormal and inconclusive findings on diagnostic imaging of breast: Secondary | ICD-10-CM

## 2021-07-13 DIAGNOSIS — R922 Inconclusive mammogram: Secondary | ICD-10-CM | POA: Diagnosis not present

## 2021-08-16 DIAGNOSIS — M79642 Pain in left hand: Secondary | ICD-10-CM | POA: Diagnosis not present

## 2021-08-16 DIAGNOSIS — Z8679 Personal history of other diseases of the circulatory system: Secondary | ICD-10-CM | POA: Diagnosis not present

## 2021-08-16 DIAGNOSIS — Z79899 Other long term (current) drug therapy: Secondary | ICD-10-CM | POA: Diagnosis not present

## 2021-08-16 DIAGNOSIS — M1990 Unspecified osteoarthritis, unspecified site: Secondary | ICD-10-CM | POA: Diagnosis not present

## 2021-08-16 DIAGNOSIS — I10 Essential (primary) hypertension: Secondary | ICD-10-CM | POA: Diagnosis not present

## 2021-08-16 DIAGNOSIS — M25561 Pain in right knee: Secondary | ICD-10-CM | POA: Diagnosis not present

## 2021-08-16 DIAGNOSIS — M79641 Pain in right hand: Secondary | ICD-10-CM | POA: Diagnosis not present

## 2021-08-16 DIAGNOSIS — M79643 Pain in unspecified hand: Secondary | ICD-10-CM | POA: Diagnosis not present

## 2021-08-16 DIAGNOSIS — M069 Rheumatoid arthritis, unspecified: Secondary | ICD-10-CM | POA: Diagnosis not present

## 2021-10-14 DIAGNOSIS — M069 Rheumatoid arthritis, unspecified: Secondary | ICD-10-CM | POA: Diagnosis not present

## 2021-10-14 DIAGNOSIS — M25561 Pain in right knee: Secondary | ICD-10-CM | POA: Diagnosis not present

## 2021-10-14 DIAGNOSIS — M79643 Pain in unspecified hand: Secondary | ICD-10-CM | POA: Diagnosis not present

## 2021-10-14 DIAGNOSIS — Z79899 Other long term (current) drug therapy: Secondary | ICD-10-CM | POA: Diagnosis not present

## 2021-10-14 DIAGNOSIS — Z8679 Personal history of other diseases of the circulatory system: Secondary | ICD-10-CM | POA: Diagnosis not present

## 2021-10-14 DIAGNOSIS — I10 Essential (primary) hypertension: Secondary | ICD-10-CM | POA: Diagnosis not present

## 2021-10-14 DIAGNOSIS — M1990 Unspecified osteoarthritis, unspecified site: Secondary | ICD-10-CM | POA: Diagnosis not present

## 2021-12-16 DIAGNOSIS — M79643 Pain in unspecified hand: Secondary | ICD-10-CM | POA: Diagnosis not present

## 2021-12-16 DIAGNOSIS — Z79899 Other long term (current) drug therapy: Secondary | ICD-10-CM | POA: Diagnosis not present

## 2021-12-16 DIAGNOSIS — M25561 Pain in right knee: Secondary | ICD-10-CM | POA: Diagnosis not present

## 2021-12-16 DIAGNOSIS — M1990 Unspecified osteoarthritis, unspecified site: Secondary | ICD-10-CM | POA: Diagnosis not present

## 2021-12-16 DIAGNOSIS — Z8679 Personal history of other diseases of the circulatory system: Secondary | ICD-10-CM | POA: Diagnosis not present

## 2021-12-16 DIAGNOSIS — I110 Hypertensive heart disease with heart failure: Secondary | ICD-10-CM | POA: Diagnosis not present

## 2021-12-16 DIAGNOSIS — I509 Heart failure, unspecified: Secondary | ICD-10-CM | POA: Diagnosis not present

## 2021-12-16 DIAGNOSIS — I10 Essential (primary) hypertension: Secondary | ICD-10-CM | POA: Diagnosis not present

## 2021-12-16 DIAGNOSIS — M069 Rheumatoid arthritis, unspecified: Secondary | ICD-10-CM | POA: Diagnosis not present

## 2021-12-27 DIAGNOSIS — Z1331 Encounter for screening for depression: Secondary | ICD-10-CM | POA: Diagnosis not present

## 2021-12-27 DIAGNOSIS — Z6841 Body Mass Index (BMI) 40.0 and over, adult: Secondary | ICD-10-CM | POA: Diagnosis not present

## 2021-12-27 DIAGNOSIS — E782 Mixed hyperlipidemia: Secondary | ICD-10-CM | POA: Diagnosis not present

## 2021-12-27 DIAGNOSIS — Z0001 Encounter for general adult medical examination with abnormal findings: Secondary | ICD-10-CM | POA: Diagnosis not present

## 2021-12-27 DIAGNOSIS — I10 Essential (primary) hypertension: Secondary | ICD-10-CM | POA: Diagnosis not present

## 2021-12-27 DIAGNOSIS — Z23 Encounter for immunization: Secondary | ICD-10-CM | POA: Diagnosis not present

## 2021-12-27 DIAGNOSIS — M069 Rheumatoid arthritis, unspecified: Secondary | ICD-10-CM | POA: Diagnosis not present

## 2022-01-18 DIAGNOSIS — J069 Acute upper respiratory infection, unspecified: Secondary | ICD-10-CM | POA: Diagnosis not present

## 2022-01-18 DIAGNOSIS — M069 Rheumatoid arthritis, unspecified: Secondary | ICD-10-CM | POA: Diagnosis not present

## 2022-01-18 DIAGNOSIS — J9801 Acute bronchospasm: Secondary | ICD-10-CM | POA: Diagnosis not present

## 2022-01-18 DIAGNOSIS — E6609 Other obesity due to excess calories: Secondary | ICD-10-CM | POA: Diagnosis not present

## 2022-01-18 DIAGNOSIS — Z6839 Body mass index (BMI) 39.0-39.9, adult: Secondary | ICD-10-CM | POA: Diagnosis not present

## 2022-01-26 DIAGNOSIS — J9801 Acute bronchospasm: Secondary | ICD-10-CM | POA: Diagnosis not present

## 2022-01-26 DIAGNOSIS — J329 Chronic sinusitis, unspecified: Secondary | ICD-10-CM | POA: Diagnosis not present

## 2022-01-26 DIAGNOSIS — E6609 Other obesity due to excess calories: Secondary | ICD-10-CM | POA: Diagnosis not present

## 2022-01-26 DIAGNOSIS — Z6839 Body mass index (BMI) 39.0-39.9, adult: Secondary | ICD-10-CM | POA: Diagnosis not present

## 2022-01-26 DIAGNOSIS — I10 Essential (primary) hypertension: Secondary | ICD-10-CM | POA: Diagnosis not present

## 2022-01-26 DIAGNOSIS — M069 Rheumatoid arthritis, unspecified: Secondary | ICD-10-CM | POA: Diagnosis not present

## 2022-01-31 ENCOUNTER — Other Ambulatory Visit (HOSPITAL_COMMUNITY): Payer: Self-pay | Admitting: Internal Medicine

## 2022-01-31 ENCOUNTER — Ambulatory Visit (HOSPITAL_COMMUNITY)
Admission: RE | Admit: 2022-01-31 | Discharge: 2022-01-31 | Disposition: A | Discharge status: Home / Self Care | Payer: Medicare HMO | Source: Ambulatory Visit | Attending: Internal Medicine | Admitting: Internal Medicine

## 2022-01-31 DIAGNOSIS — R051 Acute cough: Secondary | ICD-10-CM | POA: Insufficient documentation

## 2022-01-31 DIAGNOSIS — R059 Cough, unspecified: Secondary | ICD-10-CM | POA: Diagnosis not present

## 2022-03-07 DIAGNOSIS — I10 Essential (primary) hypertension: Secondary | ICD-10-CM | POA: Diagnosis not present

## 2022-03-07 DIAGNOSIS — M069 Rheumatoid arthritis, unspecified: Secondary | ICD-10-CM | POA: Diagnosis not present

## 2022-03-07 DIAGNOSIS — J9801 Acute bronchospasm: Secondary | ICD-10-CM | POA: Diagnosis not present

## 2022-03-07 DIAGNOSIS — Z6841 Body Mass Index (BMI) 40.0 and over, adult: Secondary | ICD-10-CM | POA: Diagnosis not present

## 2022-03-30 DIAGNOSIS — N289 Disorder of kidney and ureter, unspecified: Secondary | ICD-10-CM | POA: Diagnosis not present

## 2022-03-30 DIAGNOSIS — M25561 Pain in right knee: Secondary | ICD-10-CM | POA: Diagnosis not present

## 2022-03-30 DIAGNOSIS — I10 Essential (primary) hypertension: Secondary | ICD-10-CM | POA: Diagnosis not present

## 2022-03-30 DIAGNOSIS — Z79899 Other long term (current) drug therapy: Secondary | ICD-10-CM | POA: Diagnosis not present

## 2022-03-30 DIAGNOSIS — M1990 Unspecified osteoarthritis, unspecified site: Secondary | ICD-10-CM | POA: Diagnosis not present

## 2022-03-30 DIAGNOSIS — I509 Heart failure, unspecified: Secondary | ICD-10-CM | POA: Diagnosis not present

## 2022-03-30 DIAGNOSIS — I110 Hypertensive heart disease with heart failure: Secondary | ICD-10-CM | POA: Diagnosis not present

## 2022-03-30 DIAGNOSIS — Z8679 Personal history of other diseases of the circulatory system: Secondary | ICD-10-CM | POA: Diagnosis not present

## 2022-03-30 DIAGNOSIS — M069 Rheumatoid arthritis, unspecified: Secondary | ICD-10-CM | POA: Diagnosis not present

## 2022-06-30 DIAGNOSIS — M1990 Unspecified osteoarthritis, unspecified site: Secondary | ICD-10-CM | POA: Diagnosis not present

## 2022-06-30 DIAGNOSIS — I10 Essential (primary) hypertension: Secondary | ICD-10-CM | POA: Diagnosis not present

## 2022-06-30 DIAGNOSIS — N289 Disorder of kidney and ureter, unspecified: Secondary | ICD-10-CM | POA: Diagnosis not present

## 2022-06-30 DIAGNOSIS — Z8679 Personal history of other diseases of the circulatory system: Secondary | ICD-10-CM | POA: Diagnosis not present

## 2022-06-30 DIAGNOSIS — Z79899 Other long term (current) drug therapy: Secondary | ICD-10-CM | POA: Diagnosis not present

## 2022-06-30 DIAGNOSIS — M25561 Pain in right knee: Secondary | ICD-10-CM | POA: Diagnosis not present

## 2022-06-30 DIAGNOSIS — M069 Rheumatoid arthritis, unspecified: Secondary | ICD-10-CM | POA: Diagnosis not present

## 2022-07-18 ENCOUNTER — Other Ambulatory Visit (HOSPITAL_COMMUNITY): Payer: Self-pay | Admitting: Family Medicine

## 2022-07-18 DIAGNOSIS — Z1231 Encounter for screening mammogram for malignant neoplasm of breast: Secondary | ICD-10-CM

## 2022-07-25 ENCOUNTER — Ambulatory Visit (HOSPITAL_COMMUNITY): Payer: Medicare HMO

## 2022-10-03 DIAGNOSIS — I10 Essential (primary) hypertension: Secondary | ICD-10-CM | POA: Diagnosis not present

## 2022-10-03 DIAGNOSIS — M25561 Pain in right knee: Secondary | ICD-10-CM | POA: Diagnosis not present

## 2022-10-03 DIAGNOSIS — M1990 Unspecified osteoarthritis, unspecified site: Secondary | ICD-10-CM | POA: Diagnosis not present

## 2022-10-03 DIAGNOSIS — M069 Rheumatoid arthritis, unspecified: Secondary | ICD-10-CM | POA: Diagnosis not present

## 2022-10-03 DIAGNOSIS — N289 Disorder of kidney and ureter, unspecified: Secondary | ICD-10-CM | POA: Diagnosis not present

## 2022-10-03 DIAGNOSIS — Z79899 Other long term (current) drug therapy: Secondary | ICD-10-CM | POA: Diagnosis not present

## 2022-10-03 DIAGNOSIS — Z8679 Personal history of other diseases of the circulatory system: Secondary | ICD-10-CM | POA: Diagnosis not present

## 2023-01-03 DIAGNOSIS — M069 Rheumatoid arthritis, unspecified: Secondary | ICD-10-CM | POA: Diagnosis not present

## 2023-01-03 DIAGNOSIS — Z8679 Personal history of other diseases of the circulatory system: Secondary | ICD-10-CM | POA: Diagnosis not present

## 2023-01-03 DIAGNOSIS — M25561 Pain in right knee: Secondary | ICD-10-CM | POA: Diagnosis not present

## 2023-01-03 DIAGNOSIS — I10 Essential (primary) hypertension: Secondary | ICD-10-CM | POA: Diagnosis not present

## 2023-01-03 DIAGNOSIS — M1990 Unspecified osteoarthritis, unspecified site: Secondary | ICD-10-CM | POA: Diagnosis not present

## 2023-01-03 DIAGNOSIS — Z79899 Other long term (current) drug therapy: Secondary | ICD-10-CM | POA: Diagnosis not present

## 2023-01-03 DIAGNOSIS — N289 Disorder of kidney and ureter, unspecified: Secondary | ICD-10-CM | POA: Diagnosis not present

## 2023-03-02 ENCOUNTER — Ambulatory Visit (HOSPITAL_COMMUNITY)
Admission: RE | Admit: 2023-03-02 | Discharge: 2023-03-02 | Disposition: A | Discharge status: Home / Self Care | Payer: Medicare HMO | Source: Ambulatory Visit | Attending: Family Medicine | Admitting: Family Medicine

## 2023-03-02 DIAGNOSIS — Z1231 Encounter for screening mammogram for malignant neoplasm of breast: Secondary | ICD-10-CM | POA: Diagnosis not present

## 2023-03-07 ENCOUNTER — Other Ambulatory Visit (HOSPITAL_COMMUNITY): Payer: Self-pay | Admitting: Family Medicine

## 2023-03-07 DIAGNOSIS — R928 Other abnormal and inconclusive findings on diagnostic imaging of breast: Secondary | ICD-10-CM

## 2023-03-14 ENCOUNTER — Ambulatory Visit (HOSPITAL_COMMUNITY): Admission: RE | Admit: 2023-03-14 | Payer: Medicare HMO | Source: Ambulatory Visit

## 2023-03-14 ENCOUNTER — Inpatient Hospital Stay (HOSPITAL_COMMUNITY): Admission: RE | Admit: 2023-03-14 | Payer: Medicare HMO | Source: Ambulatory Visit

## 2023-03-21 ENCOUNTER — Emergency Department (HOSPITAL_COMMUNITY): Payer: Medicare HMO

## 2023-03-21 ENCOUNTER — Encounter (HOSPITAL_COMMUNITY): Payer: Self-pay | Admitting: *Deleted

## 2023-03-21 ENCOUNTER — Ambulatory Visit (HOSPITAL_COMMUNITY)
Admission: RE | Admit: 2023-03-21 | Discharge: 2023-03-21 | Disposition: A | Discharge status: Home / Self Care | Payer: Medicare HMO | Source: Ambulatory Visit | Attending: Family Medicine | Admitting: Family Medicine

## 2023-03-21 ENCOUNTER — Other Ambulatory Visit: Payer: Self-pay

## 2023-03-21 ENCOUNTER — Inpatient Hospital Stay (HOSPITAL_COMMUNITY)
Admission: EM | Admit: 2023-03-21 | Discharge: 2023-03-23 | DRG: 291 | Disposition: A | Discharge status: Home / Self Care | Payer: Medicare HMO | Attending: Family Medicine | Admitting: Family Medicine

## 2023-03-21 DIAGNOSIS — Z6830 Body mass index (BMI) 30.0-30.9, adult: Secondary | ICD-10-CM | POA: Diagnosis not present

## 2023-03-21 DIAGNOSIS — R29818 Other symptoms and signs involving the nervous system: Secondary | ICD-10-CM | POA: Diagnosis not present

## 2023-03-21 DIAGNOSIS — I10 Essential (primary) hypertension: Secondary | ICD-10-CM | POA: Diagnosis not present

## 2023-03-21 DIAGNOSIS — R531 Weakness: Secondary | ICD-10-CM | POA: Diagnosis not present

## 2023-03-21 DIAGNOSIS — Y92239 Unspecified place in hospital as the place of occurrence of the external cause: Secondary | ICD-10-CM | POA: Diagnosis not present

## 2023-03-21 DIAGNOSIS — I509 Heart failure, unspecified: Secondary | ICD-10-CM | POA: Diagnosis not present

## 2023-03-21 DIAGNOSIS — R928 Other abnormal and inconclusive findings on diagnostic imaging of breast: Secondary | ICD-10-CM

## 2023-03-21 DIAGNOSIS — E669 Obesity, unspecified: Secondary | ICD-10-CM | POA: Diagnosis present

## 2023-03-21 DIAGNOSIS — R0609 Other forms of dyspnea: Secondary | ICD-10-CM | POA: Diagnosis not present

## 2023-03-21 DIAGNOSIS — Z90710 Acquired absence of both cervix and uterus: Secondary | ICD-10-CM | POA: Diagnosis not present

## 2023-03-21 DIAGNOSIS — I169 Hypertensive crisis, unspecified: Secondary | ICD-10-CM | POA: Diagnosis not present

## 2023-03-21 DIAGNOSIS — I161 Hypertensive emergency: Secondary | ICD-10-CM | POA: Diagnosis not present

## 2023-03-21 DIAGNOSIS — R4701 Aphasia: Secondary | ICD-10-CM | POA: Diagnosis not present

## 2023-03-21 DIAGNOSIS — Z79631 Long term (current) use of antimetabolite agent: Secondary | ICD-10-CM | POA: Diagnosis not present

## 2023-03-21 DIAGNOSIS — D638 Anemia in other chronic diseases classified elsewhere: Secondary | ICD-10-CM | POA: Diagnosis not present

## 2023-03-21 DIAGNOSIS — T501X5A Adverse effect of loop [high-ceiling] diuretics, initial encounter: Secondary | ICD-10-CM | POA: Diagnosis not present

## 2023-03-21 DIAGNOSIS — I110 Hypertensive heart disease with heart failure: Principal | ICD-10-CM | POA: Diagnosis present

## 2023-03-21 DIAGNOSIS — I5033 Acute on chronic diastolic (congestive) heart failure: Secondary | ICD-10-CM | POA: Diagnosis not present

## 2023-03-21 DIAGNOSIS — R918 Other nonspecific abnormal finding of lung field: Secondary | ICD-10-CM | POA: Diagnosis not present

## 2023-03-21 DIAGNOSIS — E876 Hypokalemia: Secondary | ICD-10-CM | POA: Diagnosis present

## 2023-03-21 DIAGNOSIS — Z8249 Family history of ischemic heart disease and other diseases of the circulatory system: Secondary | ICD-10-CM | POA: Diagnosis not present

## 2023-03-21 DIAGNOSIS — R0989 Other specified symptoms and signs involving the circulatory and respiratory systems: Secondary | ICD-10-CM | POA: Diagnosis not present

## 2023-03-21 DIAGNOSIS — R92321 Mammographic fibroglandular density, right breast: Secondary | ICD-10-CM | POA: Diagnosis not present

## 2023-03-21 DIAGNOSIS — J811 Chronic pulmonary edema: Secondary | ICD-10-CM | POA: Diagnosis not present

## 2023-03-21 DIAGNOSIS — I700 Atherosclerosis of aorta: Secondary | ICD-10-CM | POA: Diagnosis not present

## 2023-03-21 DIAGNOSIS — G459 Transient cerebral ischemic attack, unspecified: Secondary | ICD-10-CM | POA: Diagnosis not present

## 2023-03-21 DIAGNOSIS — R0602 Shortness of breath: Secondary | ICD-10-CM

## 2023-03-21 DIAGNOSIS — I6782 Cerebral ischemia: Secondary | ICD-10-CM | POA: Diagnosis not present

## 2023-03-21 DIAGNOSIS — M069 Rheumatoid arthritis, unspecified: Secondary | ICD-10-CM | POA: Diagnosis not present

## 2023-03-21 DIAGNOSIS — Z87891 Personal history of nicotine dependence: Secondary | ICD-10-CM

## 2023-03-21 DIAGNOSIS — I6523 Occlusion and stenosis of bilateral carotid arteries: Secondary | ICD-10-CM | POA: Diagnosis not present

## 2023-03-21 DIAGNOSIS — R519 Headache, unspecified: Secondary | ICD-10-CM

## 2023-03-21 DIAGNOSIS — Z91148 Patient's other noncompliance with medication regimen for other reason: Secondary | ICD-10-CM | POA: Diagnosis not present

## 2023-03-21 DIAGNOSIS — Z833 Family history of diabetes mellitus: Secondary | ICD-10-CM

## 2023-03-21 DIAGNOSIS — Z7982 Long term (current) use of aspirin: Secondary | ICD-10-CM | POA: Diagnosis not present

## 2023-03-21 DIAGNOSIS — R921 Mammographic calcification found on diagnostic imaging of breast: Secondary | ICD-10-CM | POA: Diagnosis not present

## 2023-03-21 DIAGNOSIS — Z79899 Other long term (current) drug therapy: Secondary | ICD-10-CM

## 2023-03-21 DIAGNOSIS — D649 Anemia, unspecified: Secondary | ICD-10-CM | POA: Diagnosis present

## 2023-03-21 DIAGNOSIS — R4182 Altered mental status, unspecified: Secondary | ICD-10-CM | POA: Diagnosis not present

## 2023-03-21 DIAGNOSIS — I5031 Acute diastolic (congestive) heart failure: Secondary | ICD-10-CM | POA: Diagnosis not present

## 2023-03-21 DIAGNOSIS — I672 Cerebral atherosclerosis: Secondary | ICD-10-CM | POA: Diagnosis not present

## 2023-03-21 LAB — COMPREHENSIVE METABOLIC PANEL
ALT: 15 U/L (ref 0–44)
AST: 20 U/L (ref 15–41)
Albumin: 3.9 g/dL (ref 3.5–5.0)
Alkaline Phosphatase: 60 U/L (ref 38–126)
Anion gap: 11 (ref 5–15)
BUN: 9 mg/dL (ref 8–23)
CO2: 26 mmol/L (ref 22–32)
Calcium: 9.3 mg/dL (ref 8.9–10.3)
Chloride: 104 mmol/L (ref 98–111)
Creatinine, Ser: 0.92 mg/dL (ref 0.44–1.00)
GFR, Estimated: >60 mL/min (ref >60)
Glucose, Bld: 95 mg/dL (ref 70–99)
Potassium: 4 mmol/L (ref 3.5–5.1)
Sodium: 141 mmol/L (ref 135–145)
Total Bilirubin: 0.7 mg/dL (ref <1.2)
Total Protein: 7.4 g/dL (ref 6.5–8.1)

## 2023-03-21 LAB — CBC
HCT: 35.1 % — ABNORMAL LOW (ref 36.0–46.0)
Hemoglobin: 11 g/dL — ABNORMAL LOW (ref 12.0–15.0)
MCH: 28.9 pg (ref 26.0–34.0)
MCHC: 31.3 g/dL (ref 30.0–36.0)
MCV: 92.4 fL (ref 80.0–100.0)
Platelets: 362 10*3/uL (ref 150–400)
RBC: 3.8 MIL/uL — ABNORMAL LOW (ref 3.87–5.11)
RDW: 14.2 % (ref 11.5–15.5)
WBC: 7 10*3/uL (ref 4.0–10.5)
nRBC: 0 % (ref 0.0–0.2)

## 2023-03-21 LAB — RESP PANEL BY RT-PCR (RSV, FLU A&B, COVID)  RVPGX2
Influenza A by PCR: NEGATIVE
Influenza B by PCR: NEGATIVE
Resp Syncytial Virus by PCR: NEGATIVE
SARS Coronavirus 2 by RT PCR: NEGATIVE

## 2023-03-21 LAB — TROPONIN I (HIGH SENSITIVITY)
Troponin I (High Sensitivity): 29 ng/L — ABNORMAL HIGH (ref <18)
Troponin I (High Sensitivity): 33 ng/L — ABNORMAL HIGH (ref <18)

## 2023-03-21 LAB — BRAIN NATRIURETIC PEPTIDE: B Natriuretic Peptide: 339 pg/mL — ABNORMAL HIGH (ref 0.0–100.0)

## 2023-03-21 MED ORDER — ENOXAPARIN SODIUM 40 MG/0.4ML IJ SOSY
40.0000 mg | PREFILLED_SYRINGE | INTRAMUSCULAR | Status: DC
Start: 2023-03-21 — End: 2023-03-23
  Administered 2023-03-21 – 2023-03-22 (×2): 40 mg via SUBCUTANEOUS
  Filled 2023-03-21 (×2): qty 0.4

## 2023-03-21 MED ORDER — SPIRONOLACTONE 25 MG PO TABS
25.0000 mg | ORAL_TABLET | ORAL | Status: AC
  Administered 2023-03-21: 25 mg via ORAL
  Filled 2023-03-21: qty 1

## 2023-03-21 MED ORDER — FOLIC ACID 1 MG PO TABS
1.0000 mg | ORAL_TABLET | Freq: Every day | ORAL | Status: DC
  Administered 2023-03-22 – 2023-03-23 (×2): 1 mg via ORAL
  Filled 2023-03-21 (×2): qty 1

## 2023-03-21 MED ORDER — ACETAMINOPHEN 325 MG PO TABS
650.0000 mg | ORAL_TABLET | Freq: Four times a day (QID) | ORAL | Status: DC | PRN
  Administered 2023-03-22: 650 mg via ORAL
  Filled 2023-03-21: qty 2

## 2023-03-21 MED ORDER — VITAMIN D 25 MCG (1000 UNIT) PO TABS
1000.0000 [IU] | ORAL_TABLET | Freq: Every day | ORAL | Status: DC
  Administered 2023-03-22 – 2023-03-23 (×2): 1000 [IU] via ORAL
  Filled 2023-03-21 (×2): qty 1

## 2023-03-21 MED ORDER — PROCHLORPERAZINE EDISYLATE 10 MG/2ML IJ SOLN: 5.0000 mg | Freq: Four times a day (QID) | INTRAMUSCULAR | Status: DC | PRN

## 2023-03-21 MED ORDER — POLYETHYLENE GLYCOL 3350 17 G PO PACK: 17.0000 g | PACK | Freq: Every day | ORAL | Status: DC | PRN

## 2023-03-21 MED ORDER — FUROSEMIDE 10 MG/ML IJ SOLN
40.0000 mg | INTRAMUSCULAR | Status: AC
  Administered 2023-03-21: 40 mg via INTRAVENOUS
  Filled 2023-03-21: qty 4

## 2023-03-21 MED ORDER — HYDROXYCHLOROQUINE SULFATE 200 MG PO TABS
200.0000 mg | ORAL_TABLET | Freq: Every day | ORAL | Status: DC
  Administered 2023-03-22 – 2023-03-23 (×2): 200 mg via ORAL
  Filled 2023-03-21 (×3): qty 1

## 2023-03-21 MED ORDER — SPIRONOLACTONE 25 MG PO TABS
25.0000 mg | ORAL_TABLET | Freq: Two times a day (BID) | ORAL | Status: DC
  Administered 2023-03-21 – 2023-03-23 (×4): 25 mg via ORAL
  Filled 2023-03-21 (×4): qty 1

## 2023-03-21 MED ORDER — HYDRALAZINE HCL 20 MG/ML IJ SOLN
10.0000 mg | Freq: Once | INTRAMUSCULAR | Status: AC
  Administered 2023-03-21: 10 mg via INTRAVENOUS
  Filled 2023-03-21: qty 1

## 2023-03-21 MED ORDER — HYDRALAZINE HCL 20 MG/ML IJ SOLN
20.0000 mg | Freq: Once | INTRAMUSCULAR | Status: AC
  Administered 2023-03-21: 20 mg via INTRAVENOUS
  Filled 2023-03-21: qty 1

## 2023-03-21 MED ORDER — MELATONIN 3 MG PO TABS: 6.0000 mg | ORAL_TABLET | Freq: Every evening | ORAL | Status: DC | PRN

## 2023-03-21 MED ORDER — IRBESARTAN 150 MG PO TABS
300.0000 mg | ORAL_TABLET | ORAL | Status: AC
  Administered 2023-03-21: 300 mg via ORAL
  Filled 2023-03-21: qty 2

## 2023-03-21 MED ORDER — ASPIRIN 81 MG PO TBEC
81.0000 mg | DELAYED_RELEASE_TABLET | Freq: Every day | ORAL | Status: DC
  Administered 2023-03-21 – 2023-03-23 (×3): 81 mg via ORAL
  Filled 2023-03-21 (×3): qty 1

## 2023-03-21 NOTE — ED Provider Notes (Signed)
[place] EMERGENCY DEPARTMENT AT [facility] Provider Note   CSN: [id number] Arrival date & time: 03/21/23  1025     History {Add pertinent medical, surgical, social history, OB history to HPI:1} Chief Complaint  Patient presents with   Shortness of Breath    Diana Baker is a 77 y.o. female.  77 year old female with a history of heart failure on Lasix, hypertension on irbesartan and hydrochlorothiazide, and rheumatoid arthritis on methotrexate and hydroxychloroquine who presents to the emergency department shortness of breath.  Over the past 1.5 weeks has been having progressive shortness of breath.  Also says that she has a dry cough.  Subjective fevers at home.  Also with bilateral lower extremity swelling.  No known sick contacts.  Says that she has had headache intermittently that is right-sided and sharp.  Gradual in onset.  Waxes and wanes.  Not present currently.  Also did report some chest discomfort that was substernal earlier this morning so she decided to come into the emergency department.  No personal history of stents or MI.  No tobacco use.  Did not take her blood pressure medications today.       Home Medications Prior to Admission medications   Medication Sig Start Date End Date Taking? Authorizing Provider  folic acid (FOLVITE) 1 MG tablet Take 1 mg by mouth daily.    [provider]  furosemide (LASIX) 40 MG tablet Take 1 tablet (40 mg total) twice a week. 09/21/16   Duke Salvia, MD  hydroxychloroquine (PLAQUENIL) 200 MG tablet Take 1 tablet (200 mg total) by mouth daily. 12/25/14   Duke Salvia, MD  irbesartan (AVAPRO) 300 MG tablet Take 1 tablet (300 mg total) by mouth daily. Please make annual appt with Dr. Graciela Husbands for future refills. Thank you 08/20/18   Duke Salvia, MD  methotrexate [facility]) 2.5 MG tablet Take 4 tablets by mouth on saturdays 10/05/11   [provider]  naproxen sodium (ANAPROX) 220 MG tablet Take 440 mg by  mouth as needed. FOR PAIN    [provider]  spironolactone (ALDACTONE) 25 MG tablet Take 1 tablet (25 mg total) by mouth 2 (two) times daily. Please make your overdue 2 year appt with Dr. Graciela Husbands before anymore refills. 1st attempt 07/12/19   Duke Salvia, MD      Allergies    Patient has no known allergies.    Review of Systems   Review of Systems  Physical Exam Updated Vital Signs BP (!) 211/80 (BP Location: Right Arm) Comment: Pt states this is her normal BP.  Pulse 79   Temp 98 F (36.7 C)   Resp 16   Ht 5' 1.5" (1.562 m)   Wt 73.5 kg   SpO2 98%   BMI 30.11 kg/m  Physical Exam Vitals and nursing note reviewed.  Constitutional:      General: She is not in acute distress.    Appearance: She is well-developed.  HENT:     Head: Normocephalic and atraumatic.     Right Ear: External ear normal.     Left Ear: External ear normal.     Nose: Nose normal.  Eyes:     Extraocular Movements: Extraocular movements intact.     Conjunctiva/sclera: Conjunctivae normal.     Pupils: Pupils are equal, round, and reactive to light.  Cardiovascular:     Rate and Rhythm: Normal rate and regular rhythm.     Heart sounds: No murmur heard. Pulmonary:  Effort: Pulmonary effort is normal. No respiratory distress.     Breath sounds: Rales (Bibasilar) present.  Musculoskeletal:     Cervical back: Normal range of motion and neck supple.     Right lower leg: Edema present.     Left lower leg: Edema present.  Skin:    General: Skin is warm and dry.  Neurological:     Mental Status: She is alert and oriented to person, place, and time. Mental status is at baseline.     Cranial Nerves: No cranial nerve deficit.     Sensory: No sensory deficit.     Motor: No weakness.  Psychiatric:        Mood and Affect: Mood normal.     ED Results / Procedures / Treatments   Labs (all labs ordered are listed, but only abnormal results are displayed) Labs Reviewed  CBC - Abnormal; Notable  for the following components:      Result Value   RBC 3.80 (*)    Hemoglobin 11.0 (*)    HCT 35.1 (*)    All other components within normal limits  BRAIN NATRIURETIC PEPTIDE - Abnormal; Notable for the following components:   B Natriuretic Peptide 339.0 (*)    All other components within normal limits  TROPONIN I (HIGH SENSITIVITY) - Abnormal; Notable for the following components:   Troponin I (High Sensitivity) 29 (*)    All other components within normal limits  TROPONIN I (HIGH SENSITIVITY) - Abnormal; Notable for the following components:   Troponin I (High Sensitivity) 33 (*)    All other components within normal limits  RESP PANEL BY RT-PCR (RSV, FLU A&B, COVID)  RVPGX2  COMPREHENSIVE METABOLIC PANEL    EKG None  Radiology DG Chest 2 View  Result Date: 03/21/2023 CLINICAL DATA:  Shortness of breath. EXAM: CHEST - 2 VIEW COMPARISON:  January 31, 2022. FINDINGS: Stable cardiomediastinal silhouette. Mild central pulmonary vascular congestion. Bilateral interstitial densities are noted which may represent pulmonary edema with small bilateral pleural effusions as well as fluid within the fissures bilaterally. Bony thorax is unremarkable. IMPRESSION: Mild central pulmonary vascular congestion is noted. Bilateral densities are noted most consistent with pulmonary edema with small bilateral pleural effusions. Electronically Signed   By: Lupita Raider M.D.   On: 03/21/2023 12:31   MM 3D DIAGNOSTIC MAMMOGRAM UNILATERAL RIGHT BREAST  Result Date: 03/21/2023 CLINICAL DATA:  Screening recall for right breast calcifications. EXAM: DIGITAL DIAGNOSTIC UNILATERAL RIGHT MAMMOGRAM WITH TOMOSYNTHESIS AND CAD TECHNIQUE: Right digital diagnostic mammography and breast tomosynthesis was performed. The images were evaluated with computer-aided detection. COMPARISON:  Previous exam(s). ACR Breast Density Category b: There are scattered areas of fibroglandular density. FINDINGS: Additional tomograms  were performed of the right breast. There is a less than 0.2 cm group of calcifications in the slightly upper inner right breast possibly associated with a blood vessel/vascular structure. IMPRESSION: Probably benign right breast calcifications. RECOMMENDATION: Recommend six-month follow-up diagnostic mammography with magnification views of the right breast. I have discussed the findings and recommendations with the patient. If applicable, a reminder letter will be sent to the patient regarding the next appointment. BI-RADS CATEGORY  3: Probably benign. Electronically Signed   By: Edwin Cap M.D.   On: 03/21/2023 09:46    Procedures Procedures  {Document cardiac monitor, telemetry assessment procedure when appropriate:1}  Medications Ordered in ED Medications - No data to display  ED Course/ Medical Decision Making/ A&P   {   Click here for ABCD2,  HEART and other calculatorsREFRESH Note before signing :1}                              Medical Decision Making Amount and/or Complexity of Data Reviewed Radiology: ordered.  Risk Prescription drug management.   ***  {Document critical care time when appropriate:1} {Document review of labs and clinical decision tools ie heart score, Chads2Vasc2 etc:1}  {Document your independent review of radiology images, and any outside records:1} {Document your discussion with family members, caretakers, and with consultants:1} {Document social determinants of health affecting pt's care:1} {Document your decision making why or why not admission, treatments were needed:1} Final Clinical Impression(s) / ED Diagnoses Final diagnoses:  None    Rx / DC Orders ED Discharge Orders     None

## 2023-03-21 NOTE — ED Notes (Signed)
Float nurse passing BP meds due to primary nurse with another patient.

## 2023-03-21 NOTE — ED Notes (Signed)
Tried to call report, asked if I would call back in a few minutes.

## 2023-03-21 NOTE — ED Provider Triage Note (Signed)
Emergency Medicine Provider Triage Evaluation Note  JULLY SINKFIELD , a 77 y.o. female  was evaluated in triage.  Pt complains of chest pain, shortness of breath.  Symptoms are grossly worse over the past week.  Reports exertional worsening of "chest fullness."  Reports increased lower extremity swelling.  Denies fever but does states she has been with cough..  Review of Systems  Positive: See above Negative:   Physical Exam  BP (!) 236/99 (BP Location: Left Arm)   Pulse 91   Resp 16   Ht 5' 1.5" (1.562 m)   Wt 73.5 kg   SpO2 95%   BMI 30.11 kg/m  Gen:   Awake, no distress   Resp:  Normal effort  MSK:   Moves extremities without difficulty Other:  Lower extremity pitting edema.  Rales bilateral lung fields  Medical Decision Making  Medically screening exam initiated at 11:34 AM.  Appropriate orders placed.  SYLVER BLECHMAN was informed that the remainder of the evaluation will be completed by another provider, this initial triage assessment does not replace that evaluation, and the importance of remaining in the ED until their evaluation is complete.     Peter Garter, [place] 03/21/23 1135

## 2023-03-21 NOTE — ED Notes (Signed)
Admitting Provider at bedside. Report has been called. Pt ready for transport after MD completes assessment.

## 2023-03-21 NOTE — H&P (Signed)
History and Physical  Diana Baker ZOX:[id number] DOB: 09/30/1945 DOA: 03/21/2023  Referring physician: Dr. Eloise Harman, EDP  PCP: Assunta Found, MD  Outpatient Specialists: Cardiology Patient coming from: Home  Chief Complaint: Shortness of breath x 1 week and headache.  HPI: Diana Baker is a 77 y.o. female with medical history significant for hypertension, chronic HFpEF, obesity, rheumatoid arthritis, on methotrexate and hydroxychloroquine, medication noncompliance, who presents to the ED with complaints of progressively worsening shortness of breath x 1 week.  Associated with a dry cough and bilateral lower extremity edema.  Endorses some chest discomfort that was substernal earlier this morning.  Also endorses a severe headache.  She did not take her blood pressure medications today.  In the ED, the patient was severely hypertensive with SBP in the 230's.  Noncontrast head CT was nonacute, no intracranial hemorrhage.    Volume overload on exam with left JVD and bilateral lower extremity 2+ pitting edema.  Lab work notable for elevated BNP greater than 330.  Due to concern for acute on chronic HFpEF and hypertensive crisis, the patient received IV Lasix 40 mg x 1, IV hydralazine 20 mg x 1, IV hydralazine 10 mg x 1, and was restarted on home irbesartan and spironolactone.  Blood pressure improved.  EDP requested admission for further management of hypertensive crisis and acute on chronic HFpEF.  Admitted by [facility], hospitalist service.  ED Course: Temperature 98.5.  BP 133/50, pulse 74, respiration 18, saturation 100% on room air.  Labs study notable for BNP 339, troponin 29, 33.  Review of Systems: Review of systems as noted in the HPI. All other systems reviewed and are negative.   Past Medical History:  Diagnosis Date   Asthma    Bronchiectasis    Diastolic CHF (HCC)    in setting of anemia and PMVT 3/13 req. 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 Her smoking use included cigarettes. She has never used smokeless tobacco. She reports that she does not currently use drugs. She reports that she does not drink alcohol.   No Known Allergies  Family History  Problem Relation Age of Onset   Coronary artery disease Father    Diabetes Mother       Prior to Admission medications   Medication Sig Start Date End Date Taking? Authorizing Provider  acetaminophen (TYLENOL) 325 MG tablet Take 650 mg by mouth every 6 (six) hours as needed for mild pain (pain score 1-3).   Yes [provider]  aspirin EC 81 MG tablet Take 81 mg by mouth daily. Swallow whole.   Yes [provider]  cholecalciferol (VITAMIN D3) 25 MCG (1000 UNIT) tablet Take 1,000 Units by mouth daily.   Yes [provider]  folic acid (FOLVITE)  1 MG tablet Take 1 mg by mouth daily.   Yes [provider]  hydroxychloroquine (PLAQUENIL) 200 MG tablet Take 1 tablet (200 mg total) by mouth daily. 12/25/14  Yes Duke Salvia, MD  irbesartan (AVAPRO) 300 MG tablet Take 1 tablet (300 mg total) by mouth daily. Please make annual appt with Dr. Graciela Husbands for future refills. Thank you 08/20/18  Yes Duke Salvia, MD  methotrexate [facility]) 2.5 MG tablet Take 4 tablets by mouth on saturdays 10/05/11  Yes [provider]  spironolactone  (ALDACTONE) 25 MG tablet Take 1 tablet (25 mg total) by mouth 2 (two) times daily. Please make your overdue 2 year appt with Dr. Graciela Husbands before anymore refills. 1st attempt 07/12/19  Yes Duke Salvia, MD    Physical Exam: BP (!) 192/73   Pulse 86   Temp 97.7 F (36.5 C) (Oral)   Resp 20   Ht 5' 1.5" (1.562 m)   Wt 73.5 kg   SpO2 97%   BMI 30.11 kg/m   General: 77 y.o. year-old female well developed well nourished in no acute distress.  Alert and oriented x3. Cardiovascular: Regular rate and rhythm with no rubs or gallops.  No thyromegaly.  Left JVD noted.  2+ pitting edema lower extremities bilaterally.   Respiratory: Diffuse rales bilaterally with poor inspiratory effort. Abdomen: Soft nontender nondistended with normal bowel sounds x4 quadrants. Muskuloskeletal: No cyanosis or clubbing noted bilaterally Neuro: CN II-XII intact, strength, sensation, reflexes Skin: No ulcerative lesions noted or rashes Psychiatry: Judgement and insight appear normal. Mood is appropriate for condition and setting          Labs on Admission:  Basic Metabolic Panel: Recent Labs  Lab 03/21/23 1224  NA 141  K 4.0  CL 104  CO2 26  GLUCOSE 95  BUN 9  CREATININE 0.92  CALCIUM 9.3   Liver Function Tests: Recent Labs  Lab 03/21/23 1224  AST 20  ALT 15  ALKPHOS 60  BILITOT 0.7  PROT 7.4  ALBUMIN 3.9   No results for input(s): "LIPASE", "AMYLASE" in the last 168 hours. No results for input(s): "AMMONIA" in the last 168 hours. CBC: Recent Labs  Lab 03/21/23 1224  WBC 7.0  HGB 11.0*  HCT 35.1*  MCV 92.4  PLT 362   Cardiac Enzymes: No results for input(s): "CKTOTAL", "CKMB", "CKMBINDEX", "TROPONINI" in the last 168 hours.  BNP (last 3 results) Recent Labs    03/21/23 1224  BNP 339.0*    ProBNP (last 3 results) No results for input(s): "PROBNP" in the last 8760 hours.  CBG: No results for input(s): "GLUCAP" in the last 168 hours.  Radiological Exams on Admission: CT Head  Wo Contrast  Result Date: 03/21/2023 CLINICAL DATA:  Headache over the last week.  Shortness of breath. EXAM: CT HEAD WITHOUT CONTRAST TECHNIQUE: Contiguous axial images were obtained from the base of the skull through the vertex without intravenous contrast. RADIATION DOSE REDUCTION: This exam was performed according to the departmental dose-optimization program which includes automated exposure control, adjustment of the mA and/or kV according to patient size and/or use of iterative reconstruction technique. COMPARISON:  None Available. FINDINGS: Brain: No sign of acute infarction, mass lesion, hemorrhage, hydrocephalus or extra-axial collection. There may be minimal small vessel changes of the deep white matter. Vascular: There is atherosclerotic calcification of the major vessels at the base of the brain. Skull: Negative Sinuses/Orbits: Clear/normal Other: None IMPRESSION: No acute CT finding. Possible minimal small vessel changes of  the deep white matter. Atherosclerotic calcification of the major vessels at the base of the brain. No cause of headache identified. Electronically Signed   By: Paulina Fusi M.D.   On: 03/21/2023 15:59   DG Chest 2 View  Result Date: 03/21/2023 CLINICAL DATA:  Shortness of breath. EXAM: CHEST - 2 VIEW COMPARISON:  January 31, 2022. FINDINGS: Stable cardiomediastinal silhouette. Mild central pulmonary vascular congestion. Bilateral interstitial densities are noted which may represent pulmonary edema with small bilateral pleural effusions as well as fluid within the fissures bilaterally. Bony thorax is unremarkable. IMPRESSION: Mild central pulmonary vascular congestion is noted. Bilateral densities are noted most consistent with pulmonary edema with small bilateral pleural effusions. Electronically Signed   By: Lupita Raider M.D.   On: 03/21/2023 12:31   MM 3D DIAGNOSTIC MAMMOGRAM UNILATERAL RIGHT BREAST  Result Date: 03/21/2023 CLINICAL DATA:  Screening recall for  right breast calcifications. EXAM: DIGITAL DIAGNOSTIC UNILATERAL RIGHT MAMMOGRAM WITH TOMOSYNTHESIS AND CAD TECHNIQUE: Right digital diagnostic mammography and breast tomosynthesis was performed. The images were evaluated with computer-aided detection. COMPARISON:  Previous exam(s). ACR Breast Density Category b: There are scattered areas of fibroglandular density. FINDINGS: Additional tomograms were performed of the right breast. There is a less than 0.2 cm group of calcifications in the slightly upper inner right breast possibly associated with a blood vessel/vascular structure. IMPRESSION: Probably benign right breast calcifications. RECOMMENDATION: Recommend six-month follow-up diagnostic mammography with magnification views of the right breast. I have discussed the findings and recommendations with the patient. If applicable, a reminder letter will be sent to the patient regarding the next appointment. BI-RADS CATEGORY  3: Probably benign. Electronically Signed   By: Edwin Cap M.D.   On: 03/21/2023 09:46    EKG: I independently viewed the EKG done and my findings are as followed: None available at the time of the visit.  Assessment/Plan Present on Admission:  Hypertensive crisis  Principal Problem:   Hypertensive crisis  Hypertensive crisis, secondary to medication noncompliance Initial presentation with SBP greater than 230 BP improved after ED management Resume home oral antihypertensive Closely monitor vital signs  Elevated troponin, suspect demand ischemia in the setting of hypertensive crisis Troponin 29, 33 Follow 2D echo and fast lipid panel. Monitor on telemetry.  Acute on chronic HFpEF Presented with left JVD, bilateral lower extremity pitting edema, elevated BNP greater than 330, pulmonary edema and bilateral pleural effusions seen on chest x-ray. Started IV diuresing in the ED, IV Lasix 40 mg x 1 Continue diuresing. Start strict I's and O's and daily weight Follow 2D  echo Consult cardiology in the morning.  Rheumatoid arthritis Resume home RA therapy  Physical debility PT evaluation Fall precautions.  Obesity BMI 30 Recommend weight loss outpatient regular physical activity and healthy dieting.   Critical care time: 65 minutes.   DVT prophylaxis: Subcu developed daily  Code Status: Full code.  Family Communication: Patient's sister at bedside.  Disposition Plan: Admitted to telemetry unit.  Consults called: None.  Admission status: Inpatient status.   Status is: Inpatient The patient requires at least 2 midnights for further evaluation and treatment of present condition.    Darlin Drop MD [facility] Pager [phone]  If 7PM-7AM, please contact night-coverage [email or web address] Password [facility]  03/21/2023, 7:46 PM

## 2023-03-21 NOTE — ED Triage Notes (Signed)
Pt c/o sob x one week and headache

## 2023-03-22 ENCOUNTER — Inpatient Hospital Stay (HOSPITAL_COMMUNITY): Payer: Medicare HMO

## 2023-03-22 DIAGNOSIS — I10 Essential (primary) hypertension: Secondary | ICD-10-CM | POA: Diagnosis not present

## 2023-03-22 DIAGNOSIS — I6782 Cerebral ischemia: Secondary | ICD-10-CM | POA: Diagnosis not present

## 2023-03-22 DIAGNOSIS — I169 Hypertensive crisis, unspecified: Secondary | ICD-10-CM | POA: Diagnosis not present

## 2023-03-22 DIAGNOSIS — R4182 Altered mental status, unspecified: Secondary | ICD-10-CM | POA: Diagnosis not present

## 2023-03-22 DIAGNOSIS — I5031 Acute diastolic (congestive) heart failure: Secondary | ICD-10-CM | POA: Diagnosis not present

## 2023-03-22 DIAGNOSIS — I6523 Occlusion and stenosis of bilateral carotid arteries: Secondary | ICD-10-CM | POA: Diagnosis not present

## 2023-03-22 DIAGNOSIS — R29818 Other symptoms and signs involving the nervous system: Secondary | ICD-10-CM | POA: Diagnosis not present

## 2023-03-22 LAB — BASIC METABOLIC PANEL
Anion gap: 16 — ABNORMAL HIGH (ref 5–15)
BUN: 10 mg/dL (ref 8–23)
CO2: 22 mmol/L (ref 22–32)
Calcium: 9.4 mg/dL (ref 8.9–10.3)
Chloride: 103 mmol/L (ref 98–111)
Creatinine, Ser: 1.09 mg/dL — ABNORMAL HIGH (ref 0.44–1.00)
GFR, Estimated: 52 mL/min — ABNORMAL LOW (ref >60)
Glucose, Bld: 87 mg/dL (ref 70–99)
Potassium: 3.8 mmol/L (ref 3.5–5.1)
Sodium: 141 mmol/L (ref 135–145)

## 2023-03-22 LAB — CBC
HCT: 33.1 % — ABNORMAL LOW (ref 36.0–46.0)
Hemoglobin: 10.5 g/dL — ABNORMAL LOW (ref 12.0–15.0)
MCH: 28.7 pg (ref 26.0–34.0)
MCHC: 31.7 g/dL (ref 30.0–36.0)
MCV: 90.4 fL (ref 80.0–100.0)
Platelets: 375 10*3/uL (ref 150–400)
RBC: 3.66 MIL/uL — ABNORMAL LOW (ref 3.87–5.11)
RDW: 14 % (ref 11.5–15.5)
WBC: 8.2 10*3/uL (ref 4.0–10.5)
nRBC: 0 % (ref 0.0–0.2)

## 2023-03-22 LAB — HEMOGLOBIN A1C
Hgb A1c MFr Bld: 4.3 % — ABNORMAL LOW (ref 4.8–5.6)
Mean Plasma Glucose: 76.71 mg/dL

## 2023-03-22 LAB — LIPID PANEL
Cholesterol: 193 mg/dL (ref 0–200)
HDL: 73 mg/dL (ref >40)
LDL Cholesterol: 106 mg/dL — ABNORMAL HIGH (ref 0–99)
Total CHOL/HDL Ratio: 2.6 {ratio}
Triglycerides: 71 mg/dL (ref <150)
VLDL: 14 mg/dL (ref 0–40)

## 2023-03-22 LAB — ECHOCARDIOGRAM COMPLETE
AR max vel: 2.35 cm2
AV Area VTI: 2.51 cm2
AV Area mean vel: 2.44 cm2
AV Mean grad: 7 mm[Hg]
AV Peak grad: 12.8 mm[Hg]
Ao pk vel: 1.79 m/s
Area-P 1/2: 2.66 cm2
Height: 61.5 in
S' Lateral: 3 cm
Weight: 3135.82 [oz_av]

## 2023-03-22 LAB — PHOSPHORUS: Phosphorus: 3 mg/dL (ref 2.5–4.6)

## 2023-03-22 LAB — MAGNESIUM: Magnesium: 1.9 mg/dL (ref 1.7–2.4)

## 2023-03-22 MED ORDER — IRBESARTAN 150 MG PO TABS
300.0000 mg | ORAL_TABLET | Freq: Every day | ORAL | Status: DC
  Administered 2023-03-22 – 2023-03-23 (×2): 300 mg via ORAL
  Filled 2023-03-22 (×2): qty 2

## 2023-03-22 MED ORDER — AMLODIPINE BESYLATE 5 MG PO TABS
5.0000 mg | ORAL_TABLET | Freq: Every day | ORAL | Status: DC
  Administered 2023-03-22 – 2023-03-23 (×2): 5 mg via ORAL
  Filled 2023-03-22 (×2): qty 1

## 2023-03-22 MED ORDER — LABETALOL HCL 5 MG/ML IV SOLN: 10.0000 mg | INTRAVENOUS | Status: DC | PRN

## 2023-03-22 MED ORDER — STROKE: EARLY STAGES OF RECOVERY BOOK
Freq: Once | Status: AC
Start: 2023-03-23 — End: 2023-03-23

## 2023-03-22 MED ORDER — FUROSEMIDE 40 MG PO TABS
40.0000 mg | ORAL_TABLET | Freq: Every day | ORAL | Status: DC
  Administered 2023-03-23: 40 mg via ORAL
  Filled 2023-03-22: qty 1

## 2023-03-22 MED ORDER — FUROSEMIDE 10 MG/ML IJ SOLN
40.0000 mg | Freq: Once | INTRAMUSCULAR | Status: AC
  Administered 2023-03-22: 40 mg via INTRAVENOUS
  Filled 2023-03-22: qty 4

## 2023-03-22 NOTE — Progress Notes (Signed)
OT Cancellation Note  Patient Details Name: Diana Baker MRN: [id number] DOB: [birth year]   Cancelled Treatment:    Reason Eval/Treat Not Completed: OT screened, no needs identified, will sign off. Per discussion with PT and chart review, pt is back at her baseline. All weakness and balance deficits have resolved and she is able to ambulate and complete ADL's independently with no concerns. OT will sign off at this time. Thank you for the referral.    Trish Mage, OTR/L [facility] Acute Rehab Kyrin Garn Elane Bing Plume 03/22/2023, 3:43 PM

## 2023-03-22 NOTE — Progress Notes (Signed)
11:55 LKW 11:56 Pt utilized the call bell stating she didn't "feel well." Upon immediate nursing assessment, pt sitting on the commode noted to have sudden L-arm weakness/tingling, one-sided headache and "trouble finding my words a little" - aphasia. Code stroke initiated and pt taken to CT head then MRI brain right after.   Pt resting comfortably in bed currently with resolve of aphasia and L-arm tingling and weakness. Pt remains with a mild headache. Pt's family members updated at bedside.

## 2023-03-22 NOTE — Plan of Care (Signed)
  Problem: Activity: Goal: Risk for activity intolerance will decrease Outcome: Progressing   Problem: Coping: Goal: Level of anxiety will decrease Outcome: Progressing   Problem: Pain Management: Goal: General experience of comfort will improve Outcome: Progressing

## 2023-03-22 NOTE — Progress Notes (Addendum)
PROGRESS NOTE     Diana Baker, is a 77 y.o. female, DOB - Jul 08, 1945, SWF:[id number]  Admit date - 03/21/2023   Admitting Physician Darlin Drop, DO  Outpatient Primary MD for the patient is Assunta Found, MD  LOS - 1  Chief Complaint  Patient presents with   Shortness of Breath        Brief Narrative:   77 y.o. female with medical history significant for hypertension, chronic HFpEF, obesity, rheumatoid arthritis, on methotrexate and hydroxychloroquine, medication noncompliance with hypertensive emergency on 03/21/2023 developed episode of possible TIA on 03/22/2023    -Assessment and Plan: 1)Possible TIA-around 11:50 AM on 03/22/2023 patient developed sudden left-sided extremity weakness, headache and aphasia with inability to find words--- her sister, [place] Lions was at bedside -Code stroke was activated -CT head and MRI brain without acute findings -Neurology consult appreciated -Per neurologist strokelike symptoms probably related to hypertensive emergency -PT eval appreciated  2) hypertensive emergency-did not--need to be compliant with medications emphasized -On admission systolic blood pressure was greater than -Continue PTA Aldactone and Avapro -Add amlodipine -May add labetalol as needed elevated BP -Presented with elevated BP, CHF symptoms and neurosymptoms  3) acute on chronic diastolic dysfunction CHF--- -Echo from 03/22/2023 with EF of 30 to 35%, no regional wall motion normalities, no aortic stenosis, no mitral stenosis -due to #2 above -Continue diuresis  CRITICAL CARE Performed by: Shon Hale   Total critical care time: 47 minutes  Critical care time was exclusive of separately billable procedures and treating other patients.  around 11:50 AM on 03/22/2023 patient developed sudden left-sided extremity weakness, headache and aphasia with inability to find words--- her sister,  Lions was at bedside -Code stroke was activated -Discussed with  neurologist  Critical care was necessary to treat or prevent imminent or life-threatening deterioration.  Critical care was time spent personally by me on the following activities: development of treatment plan with patient and/or surrogate as well as nursing, discussions with consultants, evaluation of patient's response to treatment, examination of patient, obtaining history from patient or surrogate, ordering and performing treatments and interventions, ordering and review of laboratory studies, ordering and review of radiographic studies, pulse oximetry and re-evaluation of patient's condition.   Status is: Inpatient   Disposition: The patient is from: Home              Anticipated d/c is to: Home              Anticipated d/c date is: 1 day              Patient currently is not medically stable to d/c. Barriers: Not Clinically Stable-   Code Status :  -  Code Status: Full Code   Family Communication:   (patient is alert, awake and coherent)  Discussed with sister at bedside  DVT Prophylaxis  :   - SCDs enoxaparin (LOVENOX) injection 40 mg Start: 03/21/23 2200   Lab Results  Component Value Date   PLT 375 03/22/2023    Inpatient Medications  Scheduled Meds:  amLODipine  5 mg Oral Daily   aspirin EC  81 mg Oral Daily   cholecalciferol  1,000 Units Oral Daily   enoxaparin (LOVENOX) injection  40 mg Subcutaneous Q24H   folic acid  1 mg Oral Daily   furosemide  40 mg Intravenous Once   [START ON 03/23/2023] furosemide  40 mg Oral Daily   hydroxychloroquine  200 mg Oral Daily   irbesartan  300 mg Oral Daily  spironolactone  25 mg Oral BID   Continuous Infusions: PRN Meds:.acetaminophen, labetalol, melatonin, polyethylene glycol, prochlorperazine   Anti-infectives (From admission, onward)    Start     Dose/Rate Route Frequency Ordered Stop   03/22/23 1000  hydroxychloroquine (PLAQUENIL) tablet 200 mg        200 mg Oral Daily 03/21/23 1947            Subjective: Diana Baker today has no fevers, no emesis,  No chest pain,    around 11:50 AM on 03/22/2023 patient developed sudden left-sided extremity weakness, headache and aphasia with inability to find words--- her sister, [place] Lions was at bedside -Code stroke was activated   Objective: Vitals:   03/21/23 2100 03/21/23 2115 03/21/23 2154 03/22/23 0436  BP: (!) 102/43 (!) 100/54 (!) 133/50 (!) 161/62  Pulse: 63 69 74 75  Resp: 15 19 18 18   Temp:   98.5 F (36.9 C) 98.2 F (36.8 C)  TempSrc:   Oral Oral  SpO2: 99% 99% 100% 100%  Weight:    88.9 kg  Height:        Intake/Output Summary (Last 24 hours) at 03/22/2023 1045 Last data filed at 03/22/2023 0300 Gross per 24 hour  Intake 240 ml  Output --  Net 240 ml   Filed Weights   03/21/23 1036 03/22/23 0436  Weight: 73.5 kg 88.9 kg    Physical Exam  Gen:- Awake Alert,  in no apparent distress  HEENT:- [place].AT, No sclera icterus Neck-Supple Neck,No JVD,.  Lungs-improving air movement, no wheezing  CV- S1, S2 normal, regular  Abd-  +ve B.Sounds, Abd Soft, No tenderness,    Extremity/Skin:-Improving edema, pedal pulses present  Psych-affect is appropriate, oriented x3 Neuro-transient neurosymptoms have resolved, no additional new focal deficits, no tremors  Data Reviewed: I have personally reviewed following labs and imaging studies  CBC: Recent Labs  Lab 03/21/23 1224 03/22/23 0401  WBC 7.0 8.2  HGB 11.0* 10.5*  HCT 35.1* 33.1*  MCV 92.4 90.4  PLT 362 375   Basic Metabolic Panel: Recent Labs  Lab 03/21/23 1224 03/22/23 0401  NA 141 141  K 4.0 3.8  CL 104 103  CO2 26 22  GLUCOSE 95 87  BUN 9 10  CREATININE 0.92 1.09*  CALCIUM 9.3 9.4  MG  --  1.9  PHOS  --  3.0   GFR: Estimated Creatinine Clearance: 44.4 mL/min (A) (by C-G formula based on SCr of 1.09 mg/dL (H)). Liver Function Tests: Recent Labs  Lab 03/21/23 1224  AST 20  ALT 15  ALKPHOS 60  BILITOT 0.7  PROT 7.4  ALBUMIN 3.9   Recent  Results (from the past 240 hour(s))  Resp panel by RT-PCR (RSV, Flu A&B, Covid) Anterior Nasal Swab     Status: None   Collection Time: 03/21/23 10:41 AM   Specimen: Anterior Nasal Swab  Result Value Ref Range Status   SARS Coronavirus 2 by RT PCR NEGATIVE NEGATIVE Final    Comment: (NOTE) SARS-CoV-2 target nucleic acids are NOT DETECTED.  The SARS-CoV-2 RNA is generally detectable in upper respiratory specimens during the acute phase of infection. The lowest concentration of SARS-CoV-2 viral copies this assay can detect is 138 copies/mL. A negative result does not preclude SARS-Cov-2 infection and should not be used as the sole basis for treatment or other patient management decisions. A negative result may occur with  improper specimen collection/handling, submission of specimen other than nasopharyngeal swab, presence of viral mutation(s) within the areas targeted  by this assay, and inadequate number of viral copies(<138 copies/mL). A negative result must be combined with clinical observations, patient history, and epidemiological information. The expected result is Negative.  Fact Sheet for Patients:  BloggerCourse.com  Fact Sheet for Healthcare Providers:  SeriousBroker.it  This test is no t yet approved or cleared by the [place] FDA and  has been authorized for detection and/or diagnosis of SARS-CoV-2 by FDA under an Emergency Use Authorization (EUA). This EUA will remain  in effect (meaning this test can be used) for the duration of the COVID-19 declaration under Section 564(b)(1) of the Act, 21 U.S.C.section 360bbb-3(b)(1), unless the authorization is terminated  or revoked sooner.       Influenza A by PCR NEGATIVE NEGATIVE Final   Influenza B by PCR NEGATIVE NEGATIVE Final    Comment: (NOTE) The Xpert Xpress SARS-CoV-2/FLU/RSV plus assay is intended as an aid in the diagnosis of influenza from Nasopharyngeal  swab specimens and should not be used as a sole basis for treatment. Nasal washings and aspirates are unacceptable for Xpert Xpress SARS-CoV-2/FLU/RSV testing.  Fact Sheet for Patients: BloggerCourse.com  Fact Sheet for Healthcare Providers: SeriousBroker.it  This test is not yet approved or cleared by the [place] FDA and has been authorized for detection and/or diagnosis of SARS-CoV-2 by FDA under an Emergency Use Authorization (EUA). This EUA will remain in effect (meaning this test can be used) for the duration of the COVID-19 declaration under Section 564(b)(1) of the Act, 21 U.S.C. section 360bbb-3(b)(1), unless the authorization is terminated or revoked.     Resp Syncytial Virus by PCR NEGATIVE NEGATIVE Final    Comment: (NOTE) Fact Sheet for Patients: BloggerCourse.com  Fact Sheet for Healthcare Providers: SeriousBroker.it  This test is not yet approved or cleared by the [place] FDA and has been authorized for detection and/or diagnosis of SARS-CoV-2 by FDA under an Emergency Use Authorization (EUA). This EUA will remain in effect (meaning this test can be used) for the duration of the COVID-19 declaration under Section 564(b)(1) of the Act, 21 U.S.C. section 360bbb-3(b)(1), unless the authorization is terminated or revoked.  Performed at [facility], [address], [place], [place] [address]       Radiology Studies: CT Head Wo Contrast  Result Date: 03/21/2023 CLINICAL DATA:  Headache over the last week.  Shortness of breath. EXAM: CT HEAD WITHOUT CONTRAST TECHNIQUE: Contiguous axial images were obtained from the base of the skull through the vertex without intravenous contrast. RADIATION DOSE REDUCTION: This exam was performed according to the departmental dose-optimization program which includes automated exposure control, adjustment of the mA and/or kV  according to patient size and/or use of iterative reconstruction technique. COMPARISON:  None Available. FINDINGS: Brain: No sign of acute infarction, mass lesion, hemorrhage, hydrocephalus or extra-axial collection. There may be minimal small vessel changes of the deep white matter. Vascular: There is atherosclerotic calcification of the major vessels at the base of the brain. Skull: Negative Sinuses/Orbits: Clear/normal Other: None IMPRESSION: No acute CT finding. Possible minimal small vessel changes of the deep white matter. Atherosclerotic calcification of the major vessels at the base of the brain. No cause of headache identified. Electronically Signed   By: Paulina Fusi M.D.   On: 03/21/2023 15:59   DG Chest 2 View  Result Date: 03/21/2023 CLINICAL DATA:  Shortness of breath. EXAM: CHEST - 2 VIEW COMPARISON:  January 31, 2022. FINDINGS: Stable cardiomediastinal silhouette. Mild central pulmonary vascular congestion. Bilateral interstitial densities are noted which  may represent pulmonary edema with small bilateral pleural effusions as well as fluid within the fissures bilaterally. Bony thorax is unremarkable. IMPRESSION: Mild central pulmonary vascular congestion is noted. Bilateral densities are noted most consistent with pulmonary edema with small bilateral pleural effusions. Electronically Signed   By: Lupita Raider M.D.   On: 03/21/2023 12:31   MM 3D DIAGNOSTIC MAMMOGRAM UNILATERAL RIGHT BREAST  Result Date: 03/21/2023 CLINICAL DATA:  Screening recall for right breast calcifications. EXAM: DIGITAL DIAGNOSTIC UNILATERAL RIGHT MAMMOGRAM WITH TOMOSYNTHESIS AND CAD TECHNIQUE: Right digital diagnostic mammography and breast tomosynthesis was performed. The images were evaluated with computer-aided detection. COMPARISON:  Previous exam(s). ACR Breast Density Category b: There are scattered areas of fibroglandular density. FINDINGS: Additional tomograms were performed of the right breast. There is a  less than 0.2 cm group of calcifications in the slightly upper inner right breast possibly associated with a blood vessel/vascular structure. IMPRESSION: Probably benign right breast calcifications. RECOMMENDATION: Recommend six-month follow-up diagnostic mammography with magnification views of the right breast. I have discussed the findings and recommendations with the patient. If applicable, a reminder letter will be sent to the patient regarding the next appointment. BI-RADS CATEGORY  3: Probably benign. Electronically Signed   By: Edwin Cap M.D.   On: 03/21/2023 09:46     Scheduled Meds:  amLODipine  5 mg Oral Daily   aspirin EC  81 mg Oral Daily   cholecalciferol  1,000 Units Oral Daily   enoxaparin (LOVENOX) injection  40 mg Subcutaneous Q24H   folic acid  1 mg Oral Daily   furosemide  40 mg Intravenous Once   [START ON 03/23/2023] furosemide  40 mg Oral Daily   hydroxychloroquine  200 mg Oral Daily   irbesartan  300 mg Oral Daily   spironolactone  25 mg Oral BID   Continuous Infusions:   LOS: 1 day    Shon Hale M.D on 03/22/2023 at 10:45 AM  Go to [email or web address] - for contact info  [facility] - Office  [phone]  If 7PM-7AM, please contact night-coverage [email or web address] 03/22/2023, 10:45 AM

## 2023-03-22 NOTE — Plan of Care (Signed)

## 2023-03-22 NOTE — Progress Notes (Signed)
Echocardiogram 2D Echocardiogram has been performed.  Warren Lacy Gorden Stthomas RDCS 03/22/2023, 1:42 PM

## 2023-03-22 NOTE — Progress Notes (Signed)
Code Stroke called 1156  LKW 1155 as patient was getting up to [facility]. Symptoms included sudden left sided weakness, aphasia and c/o headache.  This RN met primary RN in transport to CT.  Symptoms improving as imaging being completed. This RN transported patient to MRI & then back to room. Patient continues to be A/Ox4 & endorses a slight headache still present but improvement in L sided weakness.  Primary RN French Ana notified of arrival back to unit.

## 2023-03-22 NOTE — Evaluation (Addendum)
Physical Therapy Evaluation Patient Details Name: Diana Baker MRN: [id number] DOB: May 04, 1945 Today's Date: 03/22/2023  History of Present Illness  a 77 y.o. female with medical history significant for hypertension, chronic HFpEF, obesity, rheumatoid arthritis, on methotrexate and hydroxychloroquine, medication noncompliance, who presents to the ED with complaints of progressively worsening shortness of breath x 1 week.  Associated with a dry cough and bilateral lower extremity edema.  Endorses some chest discomfort that was substernal earlier this morning.  Also endorses a severe headache.  She did not take her blood pressure medications today. As of 12/11, code stroke called @ 12pm due to onset of L sided weakness and difficulty  word finding, symptoms relsoved. MRI and Neurology examined, no acute stroke found on imaging.  Clinical Impression  Pt initially asleep upon entry, pleasantly agreeable to PT evaluation. Code stroke called early today with return to normal approximately 10-15 minutes later. Pt reporting no pain, and currently demonstrating independence with all means of mobility and balance. Pt BLE strength is symmetrical, sensation intact, and no LOB or word finding deficits throughout evaluation. Patient is performing at functional baseline, no safety concerns noted throughout evaluation.  Patient is safe to DC home with appropriate services, DME, and any other needs indicated.  PT to sign off at this time.  Please reconsult acute PT services if functional changes occur while admitted.  Thank you.        If plan is discharge home, recommend the following:     Can travel by private vehicle        Equipment Recommendations None recommended by PT  Recommendations for Other Services       Functional Status Assessment Patient has not had a recent decline in their functional status     Precautions / Restrictions Restrictions Weight Bearing Restrictions: No      Mobility  Bed  Mobility Overal bed mobility: Independent               Patient Response: Cooperative  Transfers Overall transfer level: Independent Equipment used: None                    Ambulation/Gait Ambulation/Gait assistance: Independent Gait Distance (Feet): 40 Feet Assistive device: None Gait Pattern/deviations: WFL(Within Functional Limits)          Stairs            Wheelchair Mobility     Tilt Bed Tilt Bed Patient Response: Cooperative  Modified Rankin (Stroke Patients Only)       Balance Overall balance assessment: Independent                                           Pertinent Vitals/Pain Pain Assessment Pain Assessment: No/denies pain    Home Living Family/patient expects to be discharged to:: Private residence Living Arrangements: Children Available Help at Discharge: Family;Friend(s) Type of Home: House Home Access: Stairs to enter Entrance Stairs-Rails: Right;Left;Can reach both Entrance Stairs-Number of Steps: 3-4   Home Layout: One level Home Equipment: Agricultural consultant (2 wheels);Cane - single point      Prior Function Prior Level of Function : Independent/Modified Independent             Mobility Comments: Pt reports independence with ambulation and drives. ADLs Comments: Independent with all ADLs, takes care of son who has two strokes.     Extremity/Trunk Assessment  Upper Extremity Assessment Upper Extremity Assessment: Overall WFL for tasks assessed    Lower Extremity Assessment Lower Extremity Assessment: RLE deficits/detail;LLE deficits/detail RLE Deficits / Details: 4/5 MMT Knee extension RLE Sensation: WNL LLE Deficits / Details: 4/5 MMT Knee extension LLE Sensation: WNL    Cervical / Trunk Assessment Cervical / Trunk Assessment: Kyphotic  Communication   Communication Communication: No apparent difficulties  Cognition Arousal: Alert Behavior During Therapy: WFL for tasks  assessed/performed Overall Cognitive Status: Within Functional Limits for tasks assessed                                          General Comments      Exercises     Assessment/Plan    PT Assessment Patient does not need any further PT services  PT Problem List         PT Treatment Interventions      PT Goals (Current goals can be found in the Care Plan section)       Frequency       Co-evaluation               AM-PAC PT "6 Clicks" Mobility  Outcome Measure Help needed turning from your back to your side while in a flat bed without using bedrails?: None Help needed moving from lying on your back to sitting on the side of a flat bed without using bedrails?: None Help needed moving to and from a bed to a chair (including a wheelchair)?: None Help needed standing up from a chair using your arms (e.g., wheelchair or bedside chair)?: None Help needed to walk in hospital room?: None Help needed climbing 3-5 steps with a railing? : None 6 Click Score: 24    End of Session Equipment Utilized During Treatment: Gait belt Activity Tolerance: Patient tolerated treatment well Patient left: in bed;with call bell/phone within reach Nurse Communication: Mobility status      Time: 8119-1478 PT Time Calculation (min) (ACUTE ONLY): 10 min   Charges:   PT Evaluation $PT Eval Low Complexity: 1 Low   PT General Charges $$ ACUTE PT VISIT: 1 Visit        Diana Baker, Diana Baker [facility] [phone] office  Diana Baker 03/22/2023, 3:37 PM

## 2023-03-22 NOTE — Progress Notes (Signed)
   03/22/23 1214  TOC Brief Assessment  Insurance and Status Reviewed  Patient has primary care physician Yes  Home environment has been reviewed Home with son  Prior level of function: Independent  Prior/Current Home Services No current home services  Social Determinants of Health Reivew SDOH reviewed no interventions necessary  Readmission risk has been reviewed Yes  Transition of care needs no transition of care needs at this time    Spoke with sister who shared information since patient had a code stroke called.   Transition of Care Department [facility]) has reviewed patient and no TOC needs have been identified at this time. We will continue to monitor patient advancement through interdisciplinary progression rounds. If new patient transition needs arise, please place a TOC consult.

## 2023-03-22 NOTE — Progress Notes (Signed)
Mobility Specialist Progress Note:    03/22/23 1055  Mobility  Activity Ambulated with assistance in hallway  Level of Assistance Standby assist, set-up cues, supervision of patient - no hands on  Assistive Device None  Distance Ambulated (ft) 170 ft  Range of Motion/Exercises Active;All extremities  Activity Response Tolerated well  Mobility Referral Yes  Mobility visit 1 Mobility  Mobility Specialist Start Time (ACUTE ONLY) 1000  Mobility Specialist Stop Time (ACUTE ONLY) 1015  Mobility Specialist Time Calculation (min) (ACUTE ONLY) 15 min   Pt received in bed, sister in room. Agreeable to mobility, required SBA to stand and ambulate with no AD. Tolerated well, asx throughout. Returned to room, left pt sitting EOB. All needs met.   Lawerance Bach Mobility Specialist Please contact via Special educational needs teacher or  Rehab office at [phone]

## 2023-03-22 NOTE — Progress Notes (Addendum)
Telestroke RN note:  1156-Code stroke activated. ROS 1155, pt got up to BR and had sudden left sided weakness, aphasia and c/o headache. MD at bedside.  1200-pt taken to CT  1201- Dr. Wilford Corner joined tele-neuro cart for examination.   1217-symptoms resolved, no TNK needed at this time.

## 2023-03-22 NOTE — Consult Note (Signed)
[facility] Telemedicine Consult   Requesting Provider: Dr Mariea Clonts Consult Participants: Dr. Marthe Patch, Telespecialist RN Amy   Bedside RN French Ana Location of the provider: [facility] Location of the patient: [id number] This consult was provided via telemedicine with 2-way video and audio communication. The patient/family was informed that care would be provided in this way and agreed to receive care in this manner.   Chief Complaint: Headache, left-sided weakness, word finding difficulty  HPI: Patient is a 77 year old with past medical history of uncontrolled hypertension admitted for possible hypertensive crisis and possible elevated troponin in the setting of hypertensive crisis with a sudden onset of word finding difficulty, frontal headache and some left arm numbness that was transient in nature while she attempted to get up and go to the bathroom. Last known well was around 11:50 AM and the symptoms lasted less than a minute.  A code stroke was called due to the sudden onset of symptoms. Patient was seen and evaluated on the camera while in the CT table. Imaging was personally reviewed. She denies any current symptoms and says other than mild headache her symptoms have now resolved. Denies any chest pain or shortness of breath.   Past Medical History:  Diagnosis Date   Asthma    Bronchiectasis    Diastolic CHF (HCC)    in setting of anemia and PMVT 3/13 req. 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   PMVT in setting of prolonged QT c/w Torsades due to hypoK+ (2.0);  Echo 3/13:  abnl septal motion, normal wall thickness, EF 50%, mild MR, mild LAE;  Myoview 4/13: EF 64%, no ischemia   Pulmonary nodules     Rheumatoid arthritis(714.0)      Current Facility-Administered Medications:    acetaminophen (TYLENOL) tablet 650 mg, 650 mg, Oral, Q6H PRN, Darlin Drop, DO, 650 mg at 03/22/23 1117   amLODipine (NORVASC) tablet 5 mg, 5 mg, Oral, Daily, Emokpae, Courage, MD, 5 mg at 03/22/23 1114   aspirin EC tablet 81 mg, 81 mg, Oral, Daily, Darlin Drop, DO, 81 mg at 03/22/23 0850   cholecalciferol (VITAMIN D3) 25 MCG (1000 UNIT) tablet 1,000 Units, 1,000 Units, Oral, Daily, Hall, Carole N, DO, 1,000 Units at 03/22/23 0851   enoxaparin (LOVENOX) injection 40 mg, 40 mg, Subcutaneous, Q24H, Hall, Carole N, DO, 40 mg at 03/21/23 2258   folic acid (FOLVITE) tablet 1 mg, 1 mg, Oral, Daily, Hall, Carole N, DO, 1 mg at 03/22/23 0850   [START ON 03/23/2023] furosemide (LASIX) tablet 40 mg, 40 mg, Oral, Daily, Emokpae, Courage, MD   hydroxychloroquine (PLAQUENIL) tablet 200 mg, 200 mg, Oral, Daily, Hall, Carole N, DO, 200 mg at 03/22/23 0850   irbesartan (AVAPRO) tablet 300 mg, 300 mg, Oral, Daily, Emokpae, Courage, MD, 300 mg at 03/22/23 1114   labetalol (NORMODYNE) injection 10 mg, 10 mg, Intravenous, Q4H PRN, Emokpae, Courage, MD   melatonin tablet 6 mg, 6 mg, Oral, QHS PRN, Hall, Carole N, DO   polyethylene glycol (MIRALAX / GLYCOLAX) packet 17 g, 17 g, Oral, Daily PRN, Hall, Carole N, DO   prochlorperazine (COMPAZINE) injection 5 mg, 5 mg, Intravenous, Q6H PRN, Darlin Drop, DO  spironolactone (ALDACTONE) tablet 25 mg, 25 mg, Oral, BID, Hall, Carole N, DO, 25 mg at 03/22/23 0850    LKW: 1150 hrs IV thrombolysis given?: No, mild to resolved symptoms IR Thrombectomy? No, clinical examination not consistent with LVO Modified Rankin Scale: 0-Completely asymptomatic and back to baseline post- stroke Time of teleneurologist evaluation: 12:01 PM  Exam: Vitals:   03/22/23 1118 03/22/23 1155  BP: (!) 129/44   Pulse: 66 86  Resp: 18   Temp:  98.2 F (36.8 C)  SpO2: 99% 97%    General: Awake  alert in no distress Neurological exam: Awake alert oriented x 3.  No dysarthria.  No aphasia.  Cranial nerves II to XII intact.  Motor examination with no drift in any of the 4 extremities.  Sensation intact light touch all over without extinction.  Coordination examination reveals no dysmetria.   NIHSS 1A: Level of Consciousness - 0 1B: Ask Month and Age - 0 1C: 'Blink Eyes' & 'Squeeze Hands' - 0 2: Test Horizontal Extraocular Movements - 0 3: Test Visual Fields - 0 4: Test Facial Palsy - 0 5A: Test Left Arm Motor Drift - 0 5B: Test Right Arm Motor Drift - 0 6A: Test Left Leg Motor Drift - 0 6B: Test Right Leg Motor Drift - 0 7: Test Limb Ataxia - 0 8: Test Sensation - 0 9: Test Language/Aphasia- 0 10: Test Dysarthria - 0 11: Test Extinction/Inattention - 0 NIHSS score: 0   Imaging Reviewed: CT head with no acute changes.  Aspects 10.  Labs reviewed in epic and pertinent values follow: CBC    Component Value Date/Time   WBC 8.2 03/22/2023 0401   RBC 3.66 (L) 03/22/2023 0401   HGB 10.5 (L) 03/22/2023 0401   HCT 33.1 (L) 03/22/2023 0401   PLT 375 03/22/2023 0401   MCV 90.4 03/22/2023 0401   MCH 28.7 03/22/2023 0401   MCHC 31.7 03/22/2023 0401   RDW 14.0 03/22/2023 0401   LYMPHSABS 1.2 08/02/2011 0908   MONOABS 0.3 08/02/2011 0908   EOSABS 0.4 08/02/2011 0908   BASOSABS 0.0 08/02/2011 0908   CMP     Component Value Date/Time   NA 141 03/22/2023 0401   K 3.8 03/22/2023 0401   CL 103 03/22/2023 0401   CO2 22 03/22/2023 0401   GLUCOSE 87 03/22/2023 0401   BUN 10 03/22/2023 0401   CREATININE 1.09 (H) 03/22/2023 0401   CREATININE 1.11 (H) 05/14/2013 1525   CALCIUM 9.4 03/22/2023 0401   PROT 7.4 03/21/2023 1224   ALBUMIN 3.9 03/21/2023 1224   AST 20 03/21/2023 1224   ALT 15 03/21/2023 1224   ALKPHOS 60 03/21/2023 1224   BILITOT 0.7 03/21/2023 1224   GFRNONAA 52 (L) 03/22/2023 0401   GFRAA 63 (L) 07/13/2011 0650     Assessment: 77 year old admitted for  evaluation of hypertensive emergency in the setting of noncompliance to medication, with sudden onset of headache, left-sided transient episode of numbness and possible word finding difficulty.  At the time of my evaluation, her NIH stroke scale is 0.  Her clinical exam to me is not consistent with LVO hence I did not rush into performing vessel imaging. Her symptoms could be a part of her hypertensive crisis and she needs MRI to rule out a small infarct. Not a candidate for IV thrombolysis due to mild to resolved symptoms and not a candidate for EVT due to clinical exam not consistent with LVO.  Impression: Strokelike symptoms in the setting of hypertensive  crisis, evaluate for stroke  Recommendations:  MRI brain without contrast when able to If MRI of the brain is positive, will need full stroke risk factor workup otherwise no further inpatient workup from a stroke standpoint Management of hypertensive crisis per primary team as you are.  I would not bring her blood pressures down too fast.  I would keep the goal blood pressure for a modified permissive hypertension goal of around 180 or less systolic since she came in extremely hypertensive and her pressures are being brought down slowly. Plan was relayed to Dr. Mariea Clonts   Addendum Stat MRI-negative for acute process No further inpatient neurological recommendations at this time   -- Milon Dikes, MD Neurologist [facility] Pager: [phone]

## 2023-03-23 ENCOUNTER — Inpatient Hospital Stay (HOSPITAL_COMMUNITY): Payer: Medicare HMO

## 2023-03-23 DIAGNOSIS — I169 Hypertensive crisis, unspecified: Secondary | ICD-10-CM | POA: Diagnosis not present

## 2023-03-23 DIAGNOSIS — I700 Atherosclerosis of aorta: Secondary | ICD-10-CM | POA: Diagnosis not present

## 2023-03-23 DIAGNOSIS — J811 Chronic pulmonary edema: Secondary | ICD-10-CM | POA: Diagnosis not present

## 2023-03-23 DIAGNOSIS — G459 Transient cerebral ischemic attack, unspecified: Secondary | ICD-10-CM | POA: Diagnosis not present

## 2023-03-23 DIAGNOSIS — R0609 Other forms of dyspnea: Secondary | ICD-10-CM | POA: Diagnosis not present

## 2023-03-23 LAB — BASIC METABOLIC PANEL
Anion gap: 12 (ref 5–15)
BUN: 13 mg/dL (ref 8–23)
CO2: 26 mmol/L (ref 22–32)
Calcium: 8.7 mg/dL — ABNORMAL LOW (ref 8.9–10.3)
Chloride: 99 mmol/L (ref 98–111)
Creatinine, Ser: 1.24 mg/dL — ABNORMAL HIGH (ref 0.44–1.00)
GFR, Estimated: 45 mL/min — ABNORMAL LOW (ref >60)
Glucose, Bld: 91 mg/dL (ref 70–99)
Potassium: 3.3 mmol/L — ABNORMAL LOW (ref 3.5–5.1)
Sodium: 137 mmol/L (ref 135–145)

## 2023-03-23 MED ORDER — FUROSEMIDE 20 MG PO TABS: 20.0000 mg | ORAL_TABLET | Freq: Every morning | ORAL | 3 refills | Status: DC

## 2023-03-23 MED ORDER — AMLODIPINE BESYLATE 2.5 MG PO TABS: 2.5000 mg | ORAL_TABLET | Freq: Every day | ORAL | 3 refills | Status: DC

## 2023-03-23 MED ORDER — FOLIC ACID 1 MG PO TABS: 1.0000 mg | ORAL_TABLET | Freq: Every day | ORAL | 3 refills | Status: AC

## 2023-03-23 MED ORDER — POTASSIUM CHLORIDE CRYS ER 20 MEQ PO TBCR
40.0000 meq | EXTENDED_RELEASE_TABLET | ORAL | Status: AC
  Administered 2023-03-23 (×2): 40 meq via ORAL
  Filled 2023-03-23 (×2): qty 2

## 2023-03-23 MED ORDER — ATORVASTATIN CALCIUM 10 MG PO TABS: 10.0000 mg | ORAL_TABLET | Freq: Every day | ORAL | 3 refills | Status: AC

## 2023-03-23 MED ORDER — SPIRONOLACTONE 25 MG PO TABS: 25.0000 mg | ORAL_TABLET | Freq: Every day | ORAL | 3 refills | Status: AC

## 2023-03-23 MED ORDER — IRBESARTAN 300 MG PO TABS: 300.0000 mg | ORAL_TABLET | Freq: Every day | ORAL | 3 refills | Status: AC

## 2023-03-23 MED ORDER — ASPIRIN 81 MG PO TBEC: 81.0000 mg | DELAYED_RELEASE_TABLET | Freq: Every day | ORAL | 3 refills | Status: AC

## 2023-03-23 NOTE — Plan of Care (Signed)

## 2023-03-23 NOTE — Care Management Important Message (Signed)
Important Message  Patient Details  Name: [facility] MRN: [id number] Date of Birth: 1945/05/25   Important Message Given:  N/A - LOS <3 / Initial given by admissions     Corey Harold 03/23/2023, 1:50 PM

## 2023-03-23 NOTE — Plan of Care (Signed)
  Problem: Clinical Measurements: Goal: Ability to maintain clinical measurements within normal limits will improve Outcome: Progressing Goal: Will remain free from infection Outcome: Progressing   Problem: Activity: Goal: Risk for activity intolerance will decrease Outcome: Progressing   Problem: Coping: Goal: Level of anxiety will decrease Outcome: Progressing   

## 2023-03-23 NOTE — Discharge Instructions (Signed)
1)Very low-salt diet advised--- less than 2 g of sodium per day advised 2)Weigh yourself daily, call if you gain more than 3 pounds in 1 day or more than 5 pounds in 1 week as your diuretic medications may need to be adjusted 3)Please take medications as advised 4)Please check your blood pressure about 2 to 3 times a week,  write it down and keep a diary of your Blood Pressure to take with you when you next see your primary care physician 5)Follow up with Assunta Found, MD --primary care physician within a week for recheck and reevaluation 6)Avoid ibuprofen/Advil/Aleve/Motrin/Goody Powders/Naproxen/BC powders/Meloxicam/Diclofenac/Indomethacin and other Nonsteroidal anti-inflammatory medications as these will make you more likely to bleed and can cause stomach ulcers, can also cause Kidney problems.

## 2023-03-23 NOTE — Discharge Summary (Signed)
Diana Baker, is a 77 y.o. female  DOB Jan 12, 1946  MRN [id number].  Admission date:  03/21/2023  Admitting Physician  Darlin Drop, DO  Discharge Date:  03/23/2023   Primary MD  Assunta Found, MD  Recommendations for primary care physician for things to follow:   1)Very low-salt diet advised--- less than 2 g of sodium per day advised 2)Weigh yourself daily, call if you gain more than 3 pounds in 1 day or more than 5 pounds in 1 week as your diuretic medications may need to be adjusted 3)Please take medications as advised 4)Please check your blood pressure about 2 to 3 times a week,  write it down and keep a diary of your Blood Pressure to take with you when you next see your primary care physician 5)Follow up with Assunta Found, MD --primary care physician within a week for recheck and reevaluation 6)Avoid ibuprofen/Advil/Aleve/Motrin/Goody Powders/Naproxen/BC powders/Meloxicam/Diclofenac/Indomethacin and other Nonsteroidal anti-inflammatory medications as these will make you more likely to bleed and can cause stomach ulcers, can also cause Kidney problems.   Admission Diagnosis  Hypertensive crisis [I16.9]  Discharge Diagnosis  Hypertensive crisis [I16.9]    Principal Problem:   Hypertensive crisis Active Problems:   TIA (transient ischemic attack)   Anemia   Hypokalemia   Hypertension     Past Medical History:  Diagnosis Date   Asthma    Bronchiectasis    Diastolic CHF (HCC)    in setting of anemia and PMVT 3/13 req. 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   HPI: Diana Baker is a 77 y.o. female with medical history significant for hypertension, chronic HFpEF, obesity, rheumatoid arthritis, on methotrexate and hydroxychloroquine, medication noncompliance, who presents to the ED with complaints of progressively worsening shortness of breath x 1 week.  Associated with a dry cough and bilateral lower extremity edema.  Endorses some chest discomfort that was substernal earlier this morning.  Also endorses a severe headache.  She did not take her blood pressure medications today.   In the ED, the patient was severely hypertensive with SBP in the 230's.  Noncontrast head CT was nonacute, no intracranial hemorrhage.     Volume overload on exam  with left JVD and bilateral lower extremity 2+ pitting edema.  Lab work notable for elevated BNP greater than 330.  Due to concern for acute on chronic HFpEF and hypertensive crisis, the patient received IV Lasix 40 mg x 1, IV hydralazine 20 mg x 1, IV hydralazine 10 mg x 1, and was restarted on home irbesartan and spironolactone.  Blood pressure improved.  EDP requested admission for further management of hypertensive crisis and acute on chronic HFpEF.  Admitted by [facility], hospitalist service.   ED Course: Temperature 98.5.  BP 133/50, pulse 74, respiration 18, saturation 100% on room air.  Labs study notable for BNP 339, troponin 29, 33.   Review of Systems: Review of systems as noted in the HPI. All other systems reviewed and are negative.    Hospital Course:     Brief Narrative:   77 y.o. female with medical history significant for hypertension,  chronic HFpEF, obesity, rheumatoid arthritis, on methotrexate and hydroxychloroquine, medication noncompliance with hypertensive emergency on 03/21/2023 developed episode of possible TIA on 03/22/2023     -Assessment and Plan: 1)Possible TIA-around 11:50 AM on 03/22/2023 patient developed sudden left-sided extremity weakness, headache and aphasia with inability to find words--- her sister, [place] was at bedside -Code stroke was activated -CT head and MRI brain without acute findings -Neurology consult appreciated -Per neurologist strokelike symptoms probably related to hypertensive emergency -PT eval appreciated -No further neurodeficits -A1c 4.3 -LDL 106, HDL 73 -Discharge on aspirin and Lipitor for stroke prophylaxis -Compliance with BP medications advised   2) hypertensive emergency--The need to be compliant with medications emphasized -On admission systolic blood pressure was greater than -BP has improved, okay to discharge on Aldactone, Avapro, -Added Low Dose amlodipine low dose -Presented admitted with elevated BP, CHF symptoms and neurosymptoms -Neurosymptoms and dyspnea on exertion has resolved   3) acute on chronic diastolic dysfunction CHF--- -Echo from 03/22/2023 with EF of 30 to 35%, no regional wall motion normalities, no aortic stenosis, no mitral stenosis -Improved with IV Lasix, no dyspnea on exertion, voiding well and creatinine uptrending and Now with hypokalemia -Repeat chest x-ray shows improved CHF -Discharge home on p.o. Lasix and Aldactone  4)Hypokalemia--- due to Lasix use, replace kcl  5)Rheumatoid Arthritis--- stable, continue methotrexate and Plaquenil with folic acid supplementation -Follow-up with PCP and rheumatologist  6) chronic anemia of chronic disease--- in the setting of underlying rheumatoid arthritis and immunosuppressant use -No obvious bleeding noted at this time , PCP to monitor Hgb  Discharge Condition: Stable  Follow UP    Follow-up Information     Assunta Found, MD. Schedule an appointment as soon as possible for a visit in 1 week(s).   Specialty: Family Medicine Why: BP Recheck Contact information: [address] [place] [address] [phone]                 Consults obtained - Neurololgy  Diet and Activity recommendation:  As advised  Discharge Instructions    Discharge Instructions     Call MD for:  difficulty breathing, headache or visual disturbances   Complete by: As directed    Call MD for:  persistant dizziness or light-headedness   Complete by: As directed    Call MD for:  persistant nausea and vomiting   Complete by: As directed    Call MD for:  temperature >100.4   Complete by: As directed    Diet - low sodium heart healthy   Complete by: As directed    Discharge  instructions   Complete by: As directed    1)Very low-salt diet advised--- less than 2 g of sodium per day advised 2)Weigh yourself daily, call if you gain more than 3 pounds in 1 day or more than 5 pounds in 1 week as your diuretic medications may need to be adjusted 3)Please take medications as advised 4)Please check your blood pressure about 2 to 3 times a week,  write it down and keep a diary of your Blood Pressure to take with you when you next see your primary care physician 5)Follow up with Assunta Found, MD --primary care physician within a week for recheck and reevaluation 6)Avoid ibuprofen/Advil/Aleve/Motrin/Goody Powders/Naproxen/BC powders/Meloxicam/Diclofenac/Indomethacin and other Nonsteroidal anti-inflammatory medications as these will make you more likely to bleed and can cause stomach ulcers, can also cause Kidney problems.   Increase activity slowly   Complete by: As directed        Discharge Medications     Allergies as of 03/23/2023   No Known Allergies      Medication List     TAKE these medications    acetaminophen 325 MG tablet Commonly known as: TYLENOL Take 650 mg  by mouth every 6 (six) hours as needed for mild pain (pain score 1-3).   amLODipine 2.5 MG tablet Commonly known as: NORVASC Take 1 tablet (2.5 mg total) by mouth daily. For BP   aspirin EC 81 MG tablet Take 1 tablet (81 mg total) by mouth daily with breakfast. Swallow whole. What changed: when to take this   atorvastatin 10 MG tablet Commonly known as: Lipitor Take 1 tablet (10 mg total) by mouth daily.   cholecalciferol 25 MCG (1000 UNIT) tablet Commonly known as: VITAMIN D3 Take 1,000 Units by mouth daily.   folic acid 1 MG tablet Commonly known as: FOLVITE Take 1 tablet (1 mg total) by mouth daily.   furosemide 20 MG tablet Commonly known as: Lasix Take 1 tablet (20 mg total) by mouth every morning.   hydroxychloroquine 200 MG tablet Commonly known as: PLAQUENIL Take 1 tablet (200 mg total) by mouth daily.   irbesartan 300 MG tablet Commonly known as: AVAPRO Take 1 tablet (300 mg total) by mouth daily. What changed: additional instructions   methotrexate 2.5 MG tablet Commonly known as: RHEUMATREX Take 4 tablets by mouth on saturdays   spironolactone 25 MG tablet Commonly known as: ALDACTONE Take 1 tablet (25 mg total) by mouth daily. What changed:  when to take this additional instructions       Major procedures and Radiology Reports - PLEASE review detailed and final reports for all details, in brief -   DG Chest 2 View Result Date: 03/23/2023 CLINICAL DATA:  [id number] Dyspnea and respiratory abnormalities [id number] EXAM: CHEST - 2 VIEW COMPARISON:  03/21/2023 FINDINGS: Cardiac silhouette appears mildly enlarged. Aortic atherosclerosis. Improving mild pulmonary edema. Trace bilateral pleural effusions, improving. No pneumothorax. IMPRESSION: Improving mild pulmonary edema. Electronically Signed   By: Duanne Guess D.O.   On: 03/23/2023 11:13   ECHOCARDIOGRAM COMPLETE Result Date: 03/22/2023    ECHOCARDIOGRAM REPORT   Patient Name:   Diana Baker Date of  Exam: 03/22/2023 Medical Rec #:  [id number]   Height:       61.5 in Accession #:    [id number]  Weight:       196.0 lb Date of Birth:  [birth year]    BSA:          1.884 m Patient Age:    46 years  BP:           129/44 mmHg Patient Gender: F           HR:           68 bpm. [facility] Procedure: 2D Echo, Color Doppler and Cardiac Doppler Indications:    I50.31 Acute diastolic (congestive) heart failure  History:        Patient has prior history of Echocardiogram examinations, most                 recent 07/07/2011. Risk Factors:Hypertension.  Sonographer:    Irving Burton Senior RDCS Referring Phys: [id number] CAROLE N HALL IMPRESSIONS  1. Left ventricular ejection fraction, by estimation, is 60 to 65%. Left ventricular ejection fraction by PLAX is 62 %. The left ventricle has normal function. The left ventricle has no regional wall motion abnormalities. There is mild asymmetric left ventricular hypertrophy of the infero-lateral segment. Left ventricular diastolic parameters are indeterminate. Elevated left ventricular end-diastolic pressure.  2. Right ventricular systolic function is normal. The right ventricular size is mildly enlarged. Tricuspid regurgitation signal is inadequate for assessing PA pressure.  3. Left atrial size was moderately dilated.  4. The mitral valve is normal in structure. Trivial mitral valve regurgitation. No evidence of mitral stenosis.  5. The aortic valve is tricuspid. Aortic valve regurgitation is not visualized. No aortic stenosis is present.  6. The inferior vena cava is normal in size with <50% respiratory variability, suggesting right atrial pressure of 8 mmHg. Comparison(s): No prior Echocardiogram. FINDINGS  Left Ventricle: Left ventricular ejection fraction, by estimation, is 60 to 65%. Left ventricular ejection fraction by PLAX is 62 %. The left ventricle has normal function. The left ventricle has no regional wall motion abnormalities. The left ventricular internal cavity  size was normal in size. There is mild asymmetric left ventricular hypertrophy of the infero-lateral segment. Left ventricular diastolic parameters are indeterminate. Elevated left ventricular end-diastolic pressure. Right Ventricle: The right ventricular size is mildly enlarged. No increase in right ventricular wall thickness. Right ventricular systolic function is normal. Tricuspid regurgitation signal is inadequate for assessing PA pressure. Left Atrium: Left atrial size was moderately dilated. Right Atrium: Right atrial size was normal in size. Pericardium: There is no evidence of pericardial effusion. Presence of epicardial fat layer. Mitral Valve: The mitral valve is normal in structure. Trivial mitral valve regurgitation. No evidence of mitral valve stenosis. Tricuspid Valve: The tricuspid valve is normal in structure. Tricuspid valve regurgitation is trivial. No evidence of tricuspid stenosis. Aortic Valve: The aortic valve is tricuspid. Aortic valve regurgitation is not visualized. No aortic stenosis is present. Aortic valve mean gradient measures 7.0 mmHg. Aortic valve peak gradient measures 12.8 mmHg. Aortic valve area, by VTI measures 2.51  cm. Pulmonic Valve: The pulmonic valve was normal in structure. Pulmonic valve regurgitation is trivial. No evidence of pulmonic stenosis. Aorta: The aortic root and ascending aorta are structurally normal, with no evidence of dilitation. Venous: The inferior vena cava is normal in size with less than 50% respiratory variability, suggesting right atrial pressure of 8 mmHg. IAS/Shunts: The atrial septum is grossly normal.  LEFT VENTRICLE PLAX 2D LV EF:         Left            Diastology                ventricular     LV e' medial:    5.77 cm/s  ejection        LV E/e' medial:  20.5                fraction by     LV e' lateral:   6.74 cm/s                PLAX is 62      LV E/e' lateral: 17.5                %. LVIDd:         4.50 cm LVIDs:         3.00 cm  LV PW:         1.10 cm LV IVS:        0.90 cm LVOT diam:     2.00 cm LV SV:         92 LV SV Index:   49 LVOT Area:     3.14 cm  RIGHT VENTRICLE RV S prime:     11.10 cm/s TAPSE (M-mode): 2.1 cm LEFT ATRIUM             Index        RIGHT ATRIUM           Index LA diam:        4.10 cm 2.18 cm/m   RA Area:     17.80 cm LA Vol (A2C):   91.6 ml 48.62 ml/m  RA Volume:   50.10 ml  26.59 ml/m LA Vol (A4C):   62.2 ml 33.02 ml/m LA Biplane Vol: 76.3 ml 40.50 ml/m  AORTIC VALVE AV Area (Vmax):    2.35 cm AV Area (Vmean):   2.44 cm AV Area (VTI):     2.51 cm AV Vmax:           179.00 cm/s AV Vmean:          121.000 cm/s AV VTI:            0.366 m AV Peak Grad:      12.8 mmHg AV Mean Grad:      7.0 mmHg LVOT Vmax:         134.00 cm/s LVOT Vmean:        93.900 cm/s LVOT VTI:          0.293 m LVOT/AV VTI ratio: 0.80  AORTA Ao Root diam: 2.70 cm Ao Asc diam:  3.40 cm MITRAL VALVE MV Area (PHT): 2.66 cm     SHUNTS MV Decel Time: 285 msec     Systemic VTI:  0.29 m MV E velocity: 118.00 cm/s  Systemic Diam: 2.00 cm MV A velocity: 87.90 cm/s MV E/A ratio:  1.34 Vishnu Priya Mallipeddi Electronically signed by Winfield Rast Mallipeddi Signature Date/Time: 03/22/2023/4:01:28 PM    Final    US Carotid Bilateral (at [facility] and [facility] only) Result Date: 03/22/2023 CLINICAL DATA:  Speech abnormality and hypertension EXAM: BILATERAL CAROTID DUPLEX ULTRASOUND TECHNIQUE: Wallace Cullens scale imaging, color Doppler and duplex ultrasound were performed of bilateral carotid and vertebral arteries in the neck. COMPARISON:  None Available. FINDINGS: Criteria: Quantification of carotid stenosis is based on velocity parameters that correlate the residual internal carotid diameter with NASCET-based stenosis levels, using the diameter of the distal internal carotid lumen as the denominator for stenosis measurement. The following velocity measurements were obtained: RIGHT ICA:  113/17 cm/sec CCA:  116/13 cm/sec SYSTOLIC ICA/CCA RATIO:  1.3 ECA:  246  cm/sec LEFT ICA:  115/20 cm/sec CCA:  128/11 cm/sec SYSTOLIC ICA/CCA  RATIO:  0.9 ECA:  108 cm/sec RIGHT CAROTID ARTERY: Partially calcified plaque in the right common carotid artery. Mild to moderate calcified plaque at the level of the carotid bulb and proximal right ICA. Estimated right ICA stenosis is less than 50%. RIGHT VERTEBRAL ARTERY: Antegrade flow with normal waveform and velocity. LEFT CAROTID ARTERY: Partially calcified plaque in the left common carotid artery. Mild to moderate calcified plaque at the level of the carotid bulb and proximal left ICA. Estimated left ICA stenosis is less than 50%. LEFT VERTEBRAL ARTERY: Antegrade flow with normal waveform and velocity. IMPRESSION: Mild to moderate calcified plaque at the level of both carotid bulbs and proximal internal carotid arteries. Estimated bilateral ICA stenosis is less than 50%. Electronically Signed   By: Irish Lack M.D.   On: 03/22/2023 15:37   MR BRAIN WO CONTRAST Result Date: 03/22/2023 CLINICAL DATA:  Neuro deficit, acute, stroke suspected EXAM: MRI HEAD WITHOUT CONTRAST TECHNIQUE: Multiplanar, multiecho pulse sequences of the brain and surrounding structures were obtained without intravenous contrast. COMPARISON:  Same-day head CT FINDINGS: Brain: Negative for an acute infarct. No hemorrhage. No hydrocephalus. No extra-axial fluid collection. No mass effect. No mass lesion. There is a background of mild chronic microvascular ischemic change Vascular: Normal flow voids. Skull and upper cervical spine: Normal marrow signal. Sinuses/Orbits: No middle ear or mastoid effusion. Paranasal sinuses are clear. Bilateral lens replacement. Orbits are otherwise unremarkable. Other: None. IMPRESSION: No acute intracranial process. Electronically Signed   By: Lorenza Cambridge M.D.   On: 03/22/2023 13:15   CT HEAD CODE STROKE WO CONTRAST Result Date: 03/22/2023 CLINICAL DATA:  Code stroke. Neuro deficit, acute, stroke suspected. Altered mental  status. EXAM: CT HEAD WITHOUT CONTRAST TECHNIQUE: Contiguous axial images were obtained from the base of the skull through the vertex without intravenous contrast. RADIATION DOSE REDUCTION: This exam was performed according to the departmental dose-optimization program which includes automated exposure control, adjustment of the mA and/or kV according to patient size and/or use of iterative reconstruction technique. COMPARISON:  Head CT 03/21/2023. FINDINGS: Brain: No acute hemorrhage. Stable mild chronic small-vessel disease. Cortical gray-white differentiation is preserved. No hydrocephalus or extra-axial collection. No mass effect or midline shift. Vascular: No hyperdense vessel or unexpected calcification. Skull: No calvarial fracture or suspicious bone lesion. Skull base is unremarkable. Sinuses/Orbits: No acute finding. Other: None. ASPECTS ([place] Stroke Program Early CT Score) - Ganglionic level infarction (caudate, lentiform nuclei, internal capsule, insula, M1-M3 cortex): 7 - Supraganglionic infarction (M4-M6 cortex): 3 Total score (0-10 with 10 being normal): 10 IMPRESSION: No acute intracranial hemorrhage or evidence of acute large vessel territory infarct. ASPECT score is 10. Code stroke imaging results were communicated on 03/22/2023 at 12:17 pm to provider Dr. Clementeen Graham Via telephone, who verbally acknowledged these results. Electronically Signed   By: Orvan Falconer M.D.   On: 03/22/2023 12:17   CT Head Wo Contrast Result Date: 03/21/2023 CLINICAL DATA:  Headache over the last week.  Shortness of breath. EXAM: CT HEAD WITHOUT CONTRAST TECHNIQUE: Contiguous axial images were obtained from the base of the skull through the vertex without intravenous contrast. RADIATION DOSE REDUCTION: This exam was performed according to the departmental dose-optimization program which includes automated exposure control, adjustment of the mA and/or kV according to patient size and/or use of iterative  reconstruction technique. COMPARISON:  None Available. FINDINGS: Brain: No sign of acute infarction, mass lesion, hemorrhage, hydrocephalus or extra-axial collection. There may be minimal small vessel changes of the deep white matter. Vascular: There  is atherosclerotic calcification of the major vessels at the base of the brain. Skull: Negative Sinuses/Orbits: Clear/normal Other: None IMPRESSION: No acute CT finding. Possible minimal small vessel changes of the deep white matter. Atherosclerotic calcification of the major vessels at the base of the brain. No cause of headache identified. Electronically Signed   By: Paulina Fusi M.D.   On: 03/21/2023 15:59   DG Chest 2 View Result Date: 03/21/2023 CLINICAL DATA:  Shortness of breath. EXAM: CHEST - 2 VIEW COMPARISON:  January 31, 2022. FINDINGS: Stable cardiomediastinal silhouette. Mild central pulmonary vascular congestion. Bilateral interstitial densities are noted which may represent pulmonary edema with small bilateral pleural effusions as well as fluid within the fissures bilaterally. Bony thorax is unremarkable. IMPRESSION: Mild central pulmonary vascular congestion is noted. Bilateral densities are noted most consistent with pulmonary edema with small bilateral pleural effusions. Electronically Signed   By: Lupita Raider M.D.   On: 03/21/2023 12:31   MM 3D DIAGNOSTIC MAMMOGRAM UNILATERAL RIGHT BREAST Result Date: 03/21/2023 CLINICAL DATA:  Screening recall for right breast calcifications. EXAM: DIGITAL DIAGNOSTIC UNILATERAL RIGHT MAMMOGRAM WITH TOMOSYNTHESIS AND CAD TECHNIQUE: Right digital diagnostic mammography and breast tomosynthesis was performed. The images were evaluated with computer-aided detection. COMPARISON:  Previous exam(s). ACR Breast Density Category b: There are scattered areas of fibroglandular density. FINDINGS: Additional tomograms were performed of the right breast. There is a less than 0.2 cm group of calcifications in the  slightly upper inner right breast possibly associated with a blood vessel/vascular structure. IMPRESSION: Probably benign right breast calcifications. RECOMMENDATION: Recommend six-month follow-up diagnostic mammography with magnification views of the right breast. I have discussed the findings and recommendations with the patient. If applicable, a reminder letter will be sent to the patient regarding the next appointment. BI-RADS CATEGORY  3: Probably benign. Electronically Signed   By: Edwin Cap M.D.   On: 03/21/2023 09:46   MM 3D SCREENING MAMMOGRAM BILATERAL BREAST Result Date: 03/04/2023 CLINICAL DATA:  Screening. EXAM: DIGITAL SCREENING BILATERAL MAMMOGRAM WITH TOMOSYNTHESIS AND CAD TECHNIQUE: Bilateral screening digital craniocaudal and mediolateral oblique mammograms were obtained. Bilateral screening digital breast tomosynthesis was performed. The images were evaluated with computer-aided detection. COMPARISON:  Previous exam(s). ACR Breast Density Category b: There are scattered areas of fibroglandular density. FINDINGS: In the right breast, calcifications warrant further evaluation with magnified views. In the left breast, no findings suspicious for malignancy. IMPRESSION: Further evaluation is suggested for calcifications in the right breast. RECOMMENDATION: Diagnostic mammogram of the right breast. (Code:[id number]) The patient will be contacted regarding the findings, and additional imaging will be scheduled. BI-RADS CATEGORY  0: Incomplete: Need additional imaging evaluation. Electronically Signed   By: Harmon Pier M.D.   On: 03/04/2023 08:12   Micro Results   Recent Results (from the past 240 hours)  Resp panel by RT-PCR (RSV, Flu A&B, Covid) Anterior Nasal Swab     Status: None   Collection Time: 03/21/23 10:41 AM   Specimen: Anterior Nasal Swab  Result Value Ref Range Status   SARS Coronavirus 2 by RT PCR NEGATIVE NEGATIVE Final    Comment: (NOTE) SARS-CoV-2 target nucleic acids  are NOT DETECTED.  The SARS-CoV-2 RNA is generally detectable in upper respiratory specimens during the acute phase of infection. The lowest concentration of SARS-CoV-2 viral copies this assay can detect is 138 copies/mL. A negative result does not preclude SARS-Cov-2 infection and should not be used as the sole basis for treatment or other patient management decisions. A negative result may  occur with  improper specimen collection/handling, submission of specimen other than nasopharyngeal swab, presence of viral mutation(s) within the areas targeted by this assay, and inadequate number of viral copies(<138 copies/mL). A negative result must be combined with clinical observations, patient history, and epidemiological information. The expected result is Negative.  Fact Sheet for Patients:  BloggerCourse.com  Fact Sheet for Healthcare Providers:  SeriousBroker.it  This test is no t yet approved or cleared by the [place] FDA and  has been authorized for detection and/or diagnosis of SARS-CoV-2 by FDA under an Emergency Use Authorization (EUA). This EUA will remain  in effect (meaning this test can be used) for the duration of the COVID-19 declaration under Section 564(b)(1) of the Act, 21 U.S.C.section 360bbb-3(b)(1), unless the authorization is terminated  or revoked sooner.       Influenza A by PCR NEGATIVE NEGATIVE Final   Influenza B by PCR NEGATIVE NEGATIVE Final    Comment: (NOTE) The Xpert Xpress SARS-CoV-2/FLU/RSV plus assay is intended as an aid in the diagnosis of influenza from Nasopharyngeal swab specimens and should not be used as a sole basis for treatment. Nasal washings and aspirates are unacceptable for Xpert Xpress SARS-CoV-2/FLU/RSV testing.  Fact Sheet for Patients: BloggerCourse.com  Fact Sheet for Healthcare Providers: SeriousBroker.it  This test is  not yet approved or cleared by the [place] FDA and has been authorized for detection and/or diagnosis of SARS-CoV-2 by FDA under an Emergency Use Authorization (EUA). This EUA will remain in effect (meaning this test can be used) for the duration of the COVID-19 declaration under Section 564(b)(1) of the Act, 21 U.S.C. section 360bbb-3(b)(1), unless the authorization is terminated or revoked.     Resp Syncytial Virus by PCR NEGATIVE NEGATIVE Final    Comment: (NOTE) Fact Sheet for Patients: BloggerCourse.com  Fact Sheet for Healthcare Providers: SeriousBroker.it  This test is not yet approved or cleared by the [place] FDA and has been authorized for detection and/or diagnosis of SARS-CoV-2 by FDA under an Emergency Use Authorization (EUA). This EUA will remain in effect (meaning this test can be used) for the duration of the COVID-19 declaration under Section 564(b)(1) of the Act, 21 U.S.C. section 360bbb-3(b)(1), unless the authorization is terminated or revoked.  Performed at [facility], [address], [place], [place] [address]    Today   Subjective    Diana Baker today has no new concerns  No fever  Or chills   No Nausea, Vomiting or Diarrhea No chest pains, no DOE Voiding well   -Patient Sister Foster Lions present, questions answered   Patient has been seen and examined prior to discharge   Objective   Blood pressure (!) 134/42, pulse 68, temperature 98.3 F (36.8 C), resp. rate 18, height 5' 1.5" (1.562 m), weight 88.3 kg, SpO2 98%.   Intake/Output Summary (Last 24 hours) at 03/23/2023 1358 Last data filed at 03/23/2023 1100 Gross per 24 hour  Intake 360 ml  Output 1800 ml  Net -1440 ml   Exam Gen:- Awake Alert, no acute distress  HEENT:- [place].AT, No sclera icterus Neck-Supple Neck,No JVD,.  Lungs-improved air movement, no wheezing, no rhonchi  CV- S1, S2 normal, regular Abd-  +ve B.Sounds, Abd  Soft, No tenderness,    Extremity/Skin:- No  edema,   good pulses Psych-affect is appropriate, oriented x3 Neuro-no new focal deficits, no tremors    Data Review   CBC w Diff:  Lab Results  Component Value Date   WBC 8.2 03/22/2023  HGB 10.5 (L) 03/22/2023   HCT 33.1 (L) 03/22/2023   PLT 375 03/22/2023   LYMPHOPCT 16.1 08/02/2011   MONOPCT 3.8 08/02/2011   EOSPCT 5.4 (H) 08/02/2011   BASOPCT 0.5 08/02/2011   CMP:  Lab Results  Component Value Date   NA 137 03/23/2023   K 3.3 (L) 03/23/2023   CL 99 03/23/2023   CO2 26 03/23/2023   BUN 13 03/23/2023   CREATININE 1.24 (H) 03/23/2023   CREATININE 1.11 (H) 05/14/2013   PROT 7.4 03/21/2023   ALBUMIN 3.9 03/21/2023   BILITOT 0.7 03/21/2023   ALKPHOS 60 03/21/2023   AST 20 03/21/2023   ALT 15 03/21/2023  . Total Discharge time is about 33 minutes  Shon Hale M.D on 03/23/2023 at 1:58 PM  Go to [email or web address] -  for contact info  [facility] - Office  [phone]

## 2023-03-27 DIAGNOSIS — I169 Hypertensive crisis, unspecified: Secondary | ICD-10-CM | POA: Diagnosis not present

## 2023-03-27 DIAGNOSIS — E782 Mixed hyperlipidemia: Secondary | ICD-10-CM | POA: Diagnosis not present

## 2023-03-27 DIAGNOSIS — I5030 Unspecified diastolic (congestive) heart failure: Secondary | ICD-10-CM | POA: Diagnosis not present

## 2023-03-27 DIAGNOSIS — Z6835 Body mass index (BMI) 35.0-35.9, adult: Secondary | ICD-10-CM | POA: Diagnosis not present

## 2023-03-27 DIAGNOSIS — E6609 Other obesity due to excess calories: Secondary | ICD-10-CM | POA: Diagnosis not present

## 2023-03-27 DIAGNOSIS — I10 Essential (primary) hypertension: Secondary | ICD-10-CM | POA: Diagnosis not present

## 2023-03-27 DIAGNOSIS — M069 Rheumatoid arthritis, unspecified: Secondary | ICD-10-CM | POA: Diagnosis not present

## 2023-04-18 DIAGNOSIS — E782 Mixed hyperlipidemia: Secondary | ICD-10-CM | POA: Diagnosis not present

## 2023-04-18 DIAGNOSIS — M069 Rheumatoid arthritis, unspecified: Secondary | ICD-10-CM | POA: Diagnosis not present

## 2023-04-18 DIAGNOSIS — I10 Essential (primary) hypertension: Secondary | ICD-10-CM | POA: Diagnosis not present

## 2023-04-18 DIAGNOSIS — E6609 Other obesity due to excess calories: Secondary | ICD-10-CM | POA: Diagnosis not present

## 2023-04-18 DIAGNOSIS — Z6836 Body mass index (BMI) 36.0-36.9, adult: Secondary | ICD-10-CM | POA: Diagnosis not present

## 2023-04-18 DIAGNOSIS — Z0001 Encounter for general adult medical examination with abnormal findings: Secondary | ICD-10-CM | POA: Diagnosis not present

## 2023-04-18 DIAGNOSIS — I5030 Unspecified diastolic (congestive) heart failure: Secondary | ICD-10-CM | POA: Diagnosis not present

## 2023-04-18 DIAGNOSIS — E538 Deficiency of other specified B group vitamins: Secondary | ICD-10-CM | POA: Diagnosis not present

## 2023-04-18 DIAGNOSIS — E559 Vitamin D deficiency, unspecified: Secondary | ICD-10-CM | POA: Diagnosis not present

## 2023-04-18 DIAGNOSIS — Z1331 Encounter for screening for depression: Secondary | ICD-10-CM | POA: Diagnosis not present

## 2023-04-25 DIAGNOSIS — R7989 Other specified abnormal findings of blood chemistry: Secondary | ICD-10-CM | POA: Diagnosis not present

## 2023-04-26 DIAGNOSIS — I10 Essential (primary) hypertension: Secondary | ICD-10-CM | POA: Diagnosis not present

## 2023-04-26 DIAGNOSIS — N289 Disorder of kidney and ureter, unspecified: Secondary | ICD-10-CM | POA: Diagnosis not present

## 2023-04-26 DIAGNOSIS — M1990 Unspecified osteoarthritis, unspecified site: Secondary | ICD-10-CM | POA: Diagnosis not present

## 2023-04-26 DIAGNOSIS — M25561 Pain in right knee: Secondary | ICD-10-CM | POA: Diagnosis not present

## 2023-04-26 DIAGNOSIS — Z79899 Other long term (current) drug therapy: Secondary | ICD-10-CM | POA: Diagnosis not present

## 2023-04-26 DIAGNOSIS — M069 Rheumatoid arthritis, unspecified: Secondary | ICD-10-CM | POA: Diagnosis not present

## 2023-04-26 DIAGNOSIS — Z8679 Personal history of other diseases of the circulatory system: Secondary | ICD-10-CM | POA: Diagnosis not present

## 2023-08-30 ENCOUNTER — Other Ambulatory Visit (HOSPITAL_COMMUNITY): Payer: Self-pay | Admitting: Family Medicine

## 2023-08-30 DIAGNOSIS — Z1231 Encounter for screening mammogram for malignant neoplasm of breast: Secondary | ICD-10-CM

## 2023-08-31 ENCOUNTER — Other Ambulatory Visit (HOSPITAL_COMMUNITY): Payer: Self-pay | Admitting: Family Medicine

## 2023-08-31 ENCOUNTER — Encounter (HOSPITAL_COMMUNITY): Payer: Self-pay | Admitting: Family Medicine

## 2023-08-31 DIAGNOSIS — R921 Mammographic calcification found on diagnostic imaging of breast: Secondary | ICD-10-CM

## 2023-09-26 ENCOUNTER — Encounter (HOSPITAL_COMMUNITY): Payer: Self-pay

## 2023-09-26 ENCOUNTER — Ambulatory Visit (HOSPITAL_COMMUNITY)
Admission: RE | Admit: 2023-09-26 | Discharge: 2023-09-26 | Disposition: A | Discharge status: Home / Self Care | Source: Ambulatory Visit | Attending: Family Medicine | Admitting: Family Medicine

## 2023-09-26 ENCOUNTER — Ambulatory Visit (HOSPITAL_COMMUNITY)
Admission: RE | Admit: 2023-09-26 | Discharge: 2023-09-26 | Discharge status: Home / Self Care | Source: Ambulatory Visit | Attending: Family Medicine | Admitting: Family Medicine

## 2023-09-26 ENCOUNTER — Encounter (HOSPITAL_COMMUNITY)

## 2023-09-26 DIAGNOSIS — R921 Mammographic calcification found on diagnostic imaging of breast: Secondary | ICD-10-CM | POA: Diagnosis not present

## 2023-09-26 DIAGNOSIS — R92321 Mammographic fibroglandular density, right breast: Secondary | ICD-10-CM | POA: Diagnosis not present

## 2023-09-26 DIAGNOSIS — R928 Other abnormal and inconclusive findings on diagnostic imaging of breast: Secondary | ICD-10-CM | POA: Diagnosis not present

## 2024-01-01 DIAGNOSIS — M0579 Rheumatoid arthritis with rheumatoid factor of multiple sites without organ or systems involvement: Secondary | ICD-10-CM | POA: Diagnosis not present

## 2024-01-01 DIAGNOSIS — Z8679 Personal history of other diseases of the circulatory system: Secondary | ICD-10-CM | POA: Diagnosis not present

## 2024-01-01 DIAGNOSIS — I110 Hypertensive heart disease with heart failure: Secondary | ICD-10-CM | POA: Diagnosis not present

## 2024-01-01 DIAGNOSIS — I5032 Chronic diastolic (congestive) heart failure: Secondary | ICD-10-CM | POA: Diagnosis not present

## 2024-01-01 DIAGNOSIS — Z7689 Persons encountering health services in other specified circumstances: Secondary | ICD-10-CM | POA: Diagnosis not present

## 2024-01-01 DIAGNOSIS — E782 Mixed hyperlipidemia: Secondary | ICD-10-CM | POA: Diagnosis not present

## 2024-01-01 DIAGNOSIS — I4581 Long QT syndrome: Secondary | ICD-10-CM | POA: Diagnosis not present

## 2024-01-01 DIAGNOSIS — I10 Essential (primary) hypertension: Secondary | ICD-10-CM | POA: Diagnosis not present

## 2024-01-03 ENCOUNTER — Encounter: Payer: Self-pay | Admitting: Internal Medicine

## 2024-01-03 ENCOUNTER — Telehealth: Payer: Self-pay

## 2024-01-03 ENCOUNTER — Ambulatory Visit: Attending: Internal Medicine | Admitting: Internal Medicine

## 2024-01-03 VITALS — BP 198/102 | HR 72 | Ht 61.5 in | Wt 197.4 lb

## 2024-01-03 DIAGNOSIS — I10 Essential (primary) hypertension: Secondary | ICD-10-CM

## 2024-01-03 DIAGNOSIS — I5032 Chronic diastolic (congestive) heart failure: Secondary | ICD-10-CM | POA: Diagnosis not present

## 2024-01-03 DIAGNOSIS — Z136 Encounter for screening for cardiovascular disorders: Secondary | ICD-10-CM

## 2024-01-03 DIAGNOSIS — Z79899 Other long term (current) drug therapy: Secondary | ICD-10-CM

## 2024-01-03 MED ORDER — FUROSEMIDE 20 MG PO TABS: 20.0000 mg | ORAL_TABLET | Freq: Every day | ORAL | 3 refills | Status: AC

## 2024-01-03 MED ORDER — AMLODIPINE BESYLATE 5 MG PO TABS: 5.0000 mg | ORAL_TABLET | Freq: Every day | ORAL | 3 refills | Status: AC

## 2024-01-03 MED ORDER — POTASSIUM CHLORIDE CRYS ER 20 MEQ PO TBCR: 20.0000 meq | EXTENDED_RELEASE_TABLET | Freq: Every day | ORAL | 3 refills | Status: AC

## 2024-01-03 MED ORDER — DAPAGLIFLOZIN PROPANEDIOL 10 MG PO TABS: 10.0000 mg | ORAL_TABLET | Freq: Every day | ORAL | 2 refills | Status: AC

## 2024-01-03 NOTE — Patient Instructions (Signed)
 Medication Instructions:  Your physician has recommended you make the following change in your medication:  Take Lasix  20 mg once daily Start taking Potassium Chloride  20 meq once daily Start taking Farxiga  10 mg once daily Continue taking all other medications as prescribed   Labwork: BMET in a week to be completed at [facility] in [place]  Testing/Procedures: None  Follow-Up: Your physician recommends that you schedule a follow-up appointment in: 10 months  Any Other Special Instructions Will Be Listed Below (If Applicable). Thank you for choosing [facility]!     If you need a refill on your cardiac medications before your next appointment, please call your pharmacy.

## 2024-01-03 NOTE — Telephone Encounter (Signed)
 After patient's visit per Dr. Mallipeddi:  Increase amlodipine  from 2.5 mg to 5 mg once daily.  Check blood pressures in a.m. and p.m. Follow-up in 6 months and not 10 months.  Advised patient of this change and verbalized understanding. Sent to requested pharmacy.

## 2024-01-03 NOTE — Progress Notes (Signed)
 Cardiology Office Note  Date: 01/03/2024   ID: Daquana, Paddock [birth year], MRN [id number]  PCP:  Shona Norleen PEDLAR, MD  Cardiologist:  None Electrophysiologist:  None   History of Present Illness: Diana Baker is a 78 y.o. female  Referred to cardiology clinic for evaluation of heart failure.  Patient has a diagnosis of chronic diastolic heart failure.  Echocardiogram in 2024 reviewed, normal LVEF, elevated LVEDP, CVP 8 mmHg.  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  She was following up with Dr. Fernande.  Patient is not much active, can do household chores but not any strenuous activities.  That will make her short of breath.  Does not take p.o. Lasix  20 mg once daily as it makes her pee.  Takes Lasix  as needed.  Does not have angina, dizziness, syncope, palpitations.   Past Medical History:  Diagnosis Date   Asthma    Bronchiectasis    Diastolic CHF (HCC)    in setting of anemia and PMVT 3/13 req. 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     amLODipine  (NORVASC ) 2.5 MG tablet Take 1 tablet (2.5 mg total) by mouth daily. For BP 90 tablet 3   aspirin  EC 81 MG tablet Take 1 tablet (81 mg total)  by mouth daily with breakfast. Swallow whole. 100 tablet 3   atorvastatin  (LIPITOR) 10 MG tablet Take 1 tablet (10 mg total) by mouth daily. 90 tablet 3   cholecalciferol  (VITAMIN D3) 25 MCG (1000 UNIT) tablet Take 1,000 Units by mouth daily.     dapagliflozin  propanediol (FARXIGA ) 10 MG TABS tablet Take 1 tablet (10 mg total) by mouth daily before breakfast. 90 tablet 2   folic acid  (FOLVITE ) 1 MG tablet Take 1 tablet (1 mg total) by mouth daily. 90 tablet 3   hydroxychloroquine  (PLAQUENIL ) 200 MG tablet Take 1 tablet (200 mg total) by mouth daily. 90 tablet 1   irbesartan  (AVAPRO ) 300 MG tablet Take 1 tablet (300 mg total) by mouth daily. 90 tablet 3   methotrexate (RHEUMATREX) 2.5 MG tablet Take 4 tablets by mouth on saturdays     potassium chloride  SA (KLOR-CON  M) 20 MEQ tablet Take 1 tablet (20 mEq total) by mouth daily. 90 tablet 3   spironolactone  (ALDACTONE ) 25 MG tablet Take 1 tablet (25 mg total) by mouth daily. 90 tablet 3   furosemide  (LASIX ) 20 MG tablet Take 1 tablet (20 mg total) by mouth daily. 90 tablet 3   No current facility-administered medications for this visit.   Allergies:  Patient has no known allergies.   Social History:  The patient  reports that she quit smoking about 32 years ago. Her smoking use included cigarettes. She has never used smokeless tobacco. She reports that she does not currently use drugs. She reports that she does not drink alcohol.   Family History: The patient's family history includes Coronary artery disease in her father; Diabetes in her mother.   ROS:  Please see the history of present illness. Otherwise, complete review of systems is positive for none  All other systems are reviewed and negative.   Physical Exam: VS:  BP (!) 198/102   Pulse 72   Ht 5' 1.5 (1.562 m)   Wt 197 lb 6.4 oz (89.5 kg)   SpO2 98%   BMI 36.69 kg/m , BMI Body mass index is 36.69 kg/m.  Wt Readings from Last 3 Encounters:  01/03/24 197 lb 6.4 oz (89.5 kg)   03/23/23 194 lb 10.7 oz (88.3 kg)  09/21/16 244 lb (110.7 kg)    General: Patient appears comfortable at rest. HEENT: Conjunctiva and lids normal, oropharynx clear with moist mucosa. Neck: Supple, no elevated JVP or carotid bruits, no thyromegaly. Lungs: Clear to auscultation, nonlabored breathing at rest. Cardiac: Regular rate and rhythm, no S3 or significant systolic murmur, no pericardial rub. Abdomen: Soft, nontender, no hepatomegaly, bowel sounds present, no guarding or rebound. Extremities: 2+ pitting edema, distal pulses 2+. Skin: Warm and dry. Musculoskeletal: No kyphosis. Neuropsychiatric: Alert and oriented x3, affect grossly appropriate.  Recent Labwork: 03/21/2023: ALT 15; AST 20; B Natriuretic Peptide 339.0 03/22/2023: Hemoglobin 10.5; Magnesium  1.9; Platelets 375 03/23/2023: BUN 13; Creatinine, Ser 1.24; Potassium 3.3; Sodium 137     Component Value Date/Time   CHOL 193 03/22/2023 0401   TRIG 71 03/22/2023 0401   HDL 73 03/22/2023 0401   CHOLHDL 2.6 03/22/2023 0401   VLDL 14 03/22/2023 0401   LDLCALC 106 (H) 03/22/2023 0401     Assessment and Plan:  Chronic diastolic heart failure - Not much active, can perform household chores but gets short of breath with strenuous activities.  She has 2+ pitting edema.  Prior echocardiogram from 2024 showed normal LVEF, diastolic dysfunction with elevated LVEDP, CVP 8 mmHg. - Takes p.o. Lasix  as needed, start taking p.o. Lasix  20 mg once daily.  Start KCl 20 mEq once daily. - Start Farxiga  10 mg once daily.  Obtain BMP in 5 days. - Continue irbesartan  300 mg once daily. - Continue Aldactone  25 mg once daily. - Treat underlying HTN.  HTN, poorly controlled - Has an element of whitecoat HTN probably.  Check blood pressures at home. - Continue current antihypertensives, increase amlodipine  from 2.5 mg once daily to 5 mg once daily, irbesartan  300 mg once daily and spironolactone  25 mg once daily.  Torsades de point is  secondary to severe hypokalemia in 2013 - QT interval normalized after replating potassium levels.  EKG today showed NSR, normal QT interval.  Serum potassium levels are in the range of 3 and 4.  Start KCl 20 mEq once daily.  Repeat BMP in 5 days.   40 minutes spent in reviewing prior records, imaging, Baker/reports/labs, documentation, discussion of the above problems with the patient and answering their questions.      Medication Adjustments/Labs and Tests Ordered: Current medicines are reviewed at length with the patient today.  Concerns regarding medicines are outlined above.    Disposition:  Follow up 6 months  Signed Chriss Mannan Priya Shonta Phillis, MD, 01/03/2024 1:42 PM    [facility]  [address] [place], [place], [place] [address]

## 2024-01-08 ENCOUNTER — Encounter: Payer: Self-pay | Admitting: Internal Medicine

## 2024-01-08 NOTE — Telephone Encounter (Signed)
 Error

## 2024-01-11 ENCOUNTER — Ambulatory Visit: Payer: Self-pay | Admitting: Internal Medicine

## 2024-01-11 LAB — BASIC METABOLIC PANEL WITH GFR
BUN/Creatinine Ratio: 16 (ref 12–28)
BUN: 17 mg/dL (ref 8–27)
CO2: 20 mmol/L (ref 20–29)
Calcium: 9 mg/dL (ref 8.7–10.3)
Chloride: 105 mmol/L (ref 96–106)
Creatinine, Ser: 1.09 mg/dL — ABNORMAL HIGH (ref 0.57–1.00)
Glucose: 79 mg/dL (ref 70–99)
Potassium: 4.2 mmol/L (ref 3.5–5.2)
Sodium: 139 mmol/L (ref 134–144)
eGFR: 52 mL/min/1.73 — ABNORMAL LOW (ref >59)

## 2024-01-11 NOTE — Telephone Encounter (Signed)
 Left detailed message per DPR and sent copy of lab results to PCP.

## 2024-01-11 NOTE — Telephone Encounter (Signed)
-----   Message from Vishnu P Mallipeddi sent at 01/11/2024  9:53 AM EDT ----- Serum creatinine stable, continue current medications. ----- Message ----- From: Interface, [facility] Lab Results In Sent: 01/11/2024   5:39 AM EDT To: Vishnu P Mallipeddi, MD

## 2024-02-09 ENCOUNTER — Encounter (HOSPITAL_COMMUNITY): Payer: Self-pay | Admitting: Internal Medicine

## 2024-02-12 ENCOUNTER — Other Ambulatory Visit (HOSPITAL_COMMUNITY): Payer: Self-pay | Admitting: Internal Medicine

## 2024-02-12 DIAGNOSIS — R921 Mammographic calcification found on diagnostic imaging of breast: Secondary | ICD-10-CM

## 2024-03-05 ENCOUNTER — Ambulatory Visit (HOSPITAL_COMMUNITY)
Admission: RE | Admit: 2024-03-05 | Discharge: 2024-03-05 | Disposition: A | Source: Ambulatory Visit | Attending: Internal Medicine | Admitting: Internal Medicine

## 2024-03-05 ENCOUNTER — Ambulatory Visit (HOSPITAL_COMMUNITY)
Admission: RE | Admit: 2024-03-05 | Discharge: 2024-03-05 | Disposition: A | Discharge status: Home / Self Care | Source: Ambulatory Visit | Attending: Internal Medicine | Admitting: Internal Medicine

## 2024-03-05 ENCOUNTER — Encounter (HOSPITAL_COMMUNITY): Payer: Self-pay

## 2024-03-05 DIAGNOSIS — R928 Other abnormal and inconclusive findings on diagnostic imaging of breast: Secondary | ICD-10-CM | POA: Diagnosis not present

## 2024-03-05 DIAGNOSIS — R921 Mammographic calcification found on diagnostic imaging of breast: Secondary | ICD-10-CM

## 2024-03-05 DIAGNOSIS — R92323 Mammographic fibroglandular density, bilateral breasts: Secondary | ICD-10-CM | POA: Diagnosis not present

## 2024-05-14 ENCOUNTER — Encounter: Payer: Self-pay | Admitting: *Deleted

## 2024-05-14 NOTE — Progress Notes (Signed)
 Diana Baker                                          MRN: [id number]   05/14/2024   The [facility] Quality Team Specialist reviewed this patient medical record for the purposes of chart review for care gap closure. The following were reviewed: chart review for care gap closure-controlling blood pressure.    [facility] Quality Team

## 2024-07-09 ENCOUNTER — Ambulatory Visit: Admitting: Internal Medicine
